# Patient Record
Sex: Female | Born: 1975 | Race: White | Hispanic: No | Smoking: Current every day smoker
Health system: Southern US, Community
[De-identification: ages and names within clinical notes are randomized; demographics above are authoritative.]

## PROBLEM LIST (undated history)

## (undated) DIAGNOSIS — F329 Major depressive disorder, single episode, unspecified: Secondary | ICD-10-CM

## (undated) DIAGNOSIS — F32A Depression, unspecified: Secondary | ICD-10-CM

## (undated) HISTORY — PX: CHOLECYSTECTOMY: SHX55

## (undated) HISTORY — PX: TUBAL LIGATION: SHX77

---

## 2001-02-10 ENCOUNTER — Emergency Department (HOSPITAL_COMMUNITY): Admission: EM | Admit: 2001-02-10 | Discharge: 2001-02-10 | Payer: Self-pay | Admitting: Emergency Medicine

## 2001-09-05 ENCOUNTER — Emergency Department (HOSPITAL_COMMUNITY): Admission: EM | Admit: 2001-09-05 | Discharge: 2001-09-05 | Payer: Self-pay | Admitting: Emergency Medicine

## 2001-09-05 ENCOUNTER — Encounter: Payer: Self-pay | Admitting: Emergency Medicine

## 2004-01-06 ENCOUNTER — Emergency Department (HOSPITAL_COMMUNITY): Admission: EM | Admit: 2004-01-06 | Discharge: 2004-01-06 | Payer: Self-pay

## 2006-10-13 ENCOUNTER — Emergency Department (HOSPITAL_COMMUNITY): Admission: EM | Admit: 2006-10-13 | Discharge: 2006-10-14 | Payer: Self-pay | Admitting: Emergency Medicine

## 2006-10-22 ENCOUNTER — Emergency Department (HOSPITAL_COMMUNITY): Admission: EM | Admit: 2006-10-22 | Discharge: 2006-10-22 | Payer: Self-pay | Admitting: Emergency Medicine

## 2007-08-20 ENCOUNTER — Inpatient Hospital Stay (HOSPITAL_COMMUNITY): Admission: AD | Admit: 2007-08-20 | Discharge: 2007-08-20 | Payer: Self-pay | Admitting: Obstetrics and Gynecology

## 2007-10-16 ENCOUNTER — Ambulatory Visit (HOSPITAL_COMMUNITY): Admission: RE | Admit: 2007-10-16 | Discharge: 2007-10-16 | Payer: Self-pay | Admitting: Obstetrics and Gynecology

## 2007-11-13 ENCOUNTER — Ambulatory Visit (HOSPITAL_COMMUNITY): Admission: RE | Admit: 2007-11-13 | Discharge: 2007-11-13 | Payer: Self-pay | Admitting: Obstetrics and Gynecology

## 2007-12-26 ENCOUNTER — Ambulatory Visit (HOSPITAL_COMMUNITY): Admission: RE | Admit: 2007-12-26 | Discharge: 2007-12-26 | Payer: Self-pay | Admitting: Obstetrics and Gynecology

## 2008-01-09 ENCOUNTER — Ambulatory Visit (HOSPITAL_COMMUNITY): Admission: RE | Admit: 2008-01-09 | Discharge: 2008-01-09 | Payer: Self-pay | Admitting: Obstetrics and Gynecology

## 2008-02-05 ENCOUNTER — Ambulatory Visit (HOSPITAL_COMMUNITY): Admission: RE | Admit: 2008-02-05 | Discharge: 2008-02-05 | Payer: Self-pay | Admitting: Obstetrics and Gynecology

## 2008-10-09 ENCOUNTER — Emergency Department (HOSPITAL_COMMUNITY): Admission: EM | Admit: 2008-10-09 | Discharge: 2008-10-09 | Payer: Self-pay | Admitting: Emergency Medicine

## 2009-02-19 ENCOUNTER — Emergency Department (HOSPITAL_BASED_OUTPATIENT_CLINIC_OR_DEPARTMENT_OTHER): Admission: EM | Admit: 2009-02-19 | Discharge: 2009-02-19 | Payer: Self-pay | Admitting: Emergency Medicine

## 2009-02-19 ENCOUNTER — Ambulatory Visit: Payer: Self-pay | Admitting: Radiology

## 2009-08-19 ENCOUNTER — Emergency Department (HOSPITAL_BASED_OUTPATIENT_CLINIC_OR_DEPARTMENT_OTHER): Admission: EM | Admit: 2009-08-19 | Discharge: 2009-08-19 | Payer: Self-pay | Admitting: Emergency Medicine

## 2009-11-26 ENCOUNTER — Emergency Department (HOSPITAL_BASED_OUTPATIENT_CLINIC_OR_DEPARTMENT_OTHER): Admission: EM | Admit: 2009-11-26 | Discharge: 2009-11-26 | Payer: Self-pay | Admitting: Emergency Medicine

## 2009-11-26 ENCOUNTER — Ambulatory Visit: Payer: Self-pay | Admitting: Diagnostic Radiology

## 2010-09-04 ENCOUNTER — Emergency Department (HOSPITAL_BASED_OUTPATIENT_CLINIC_OR_DEPARTMENT_OTHER)
Admission: EM | Admit: 2010-09-04 | Discharge: 2010-09-04 | Disposition: A | Discharge status: Home / Self Care | Payer: Medicaid Other | Attending: Emergency Medicine | Admitting: Emergency Medicine

## 2010-09-04 ENCOUNTER — Emergency Department (INDEPENDENT_AMBULATORY_CARE_PROVIDER_SITE_OTHER): Payer: Medicaid Other

## 2010-09-04 DIAGNOSIS — S93409A Sprain of unspecified ligament of unspecified ankle, initial encounter: Secondary | ICD-10-CM | POA: Insufficient documentation

## 2010-09-04 DIAGNOSIS — E669 Obesity, unspecified: Secondary | ICD-10-CM | POA: Insufficient documentation

## 2010-09-04 DIAGNOSIS — X500XXA Overexertion from strenuous movement or load, initial encounter: Secondary | ICD-10-CM

## 2010-09-04 DIAGNOSIS — F17200 Nicotine dependence, unspecified, uncomplicated: Secondary | ICD-10-CM | POA: Insufficient documentation

## 2010-09-04 DIAGNOSIS — W19XXXA Unspecified fall, initial encounter: Secondary | ICD-10-CM | POA: Insufficient documentation

## 2010-09-04 DIAGNOSIS — M25579 Pain in unspecified ankle and joints of unspecified foot: Secondary | ICD-10-CM

## 2010-09-04 DIAGNOSIS — Y92009 Unspecified place in unspecified non-institutional (private) residence as the place of occurrence of the external cause: Secondary | ICD-10-CM | POA: Insufficient documentation

## 2010-09-08 ENCOUNTER — Ambulatory Visit: Payer: Medicaid Other | Admitting: Family Medicine

## 2010-12-01 ENCOUNTER — Emergency Department (HOSPITAL_BASED_OUTPATIENT_CLINIC_OR_DEPARTMENT_OTHER)
Admission: EM | Admit: 2010-12-01 | Discharge: 2010-12-01 | Disposition: A | Discharge status: Home / Self Care | Payer: Medicaid Other | Attending: Emergency Medicine | Admitting: Emergency Medicine

## 2010-12-01 ENCOUNTER — Emergency Department (INDEPENDENT_AMBULATORY_CARE_PROVIDER_SITE_OTHER): Payer: Medicaid Other

## 2010-12-01 DIAGNOSIS — M549 Dorsalgia, unspecified: Secondary | ICD-10-CM | POA: Insufficient documentation

## 2010-12-01 DIAGNOSIS — M546 Pain in thoracic spine: Secondary | ICD-10-CM

## 2010-12-01 DIAGNOSIS — E669 Obesity, unspecified: Secondary | ICD-10-CM | POA: Insufficient documentation

## 2010-12-01 DIAGNOSIS — W19XXXA Unspecified fall, initial encounter: Secondary | ICD-10-CM

## 2010-12-01 DIAGNOSIS — G8929 Other chronic pain: Secondary | ICD-10-CM | POA: Insufficient documentation

## 2010-12-06 ENCOUNTER — Encounter: Payer: Self-pay | Admitting: *Deleted

## 2010-12-06 ENCOUNTER — Emergency Department (HOSPITAL_BASED_OUTPATIENT_CLINIC_OR_DEPARTMENT_OTHER)
Admission: EM | Admit: 2010-12-06 | Discharge: 2010-12-06 | Disposition: A | Discharge status: Home / Self Care | Payer: Medicaid Other | Attending: Emergency Medicine | Admitting: Emergency Medicine

## 2010-12-06 ENCOUNTER — Emergency Department (INDEPENDENT_AMBULATORY_CARE_PROVIDER_SITE_OTHER): Payer: Medicaid Other

## 2010-12-06 DIAGNOSIS — N898 Other specified noninflammatory disorders of vagina: Secondary | ICD-10-CM

## 2010-12-06 DIAGNOSIS — N920 Excessive and frequent menstruation with regular cycle: Secondary | ICD-10-CM | POA: Insufficient documentation

## 2010-12-06 LAB — CBC
MCH: 25.6 pg — ABNORMAL LOW (ref 26.0–34.0)
MCHC: 32.2 g/dL (ref 30.0–36.0)
MCV: 79.5 fL (ref 78.0–100.0)
Platelets: 229 10*3/uL (ref 150–400)
RDW: 16 % — ABNORMAL HIGH (ref 11.5–15.5)

## 2010-12-06 MED ORDER — NORGESTREL-ETHINYL ESTRADIOL 0.3-30 MG-MCG PO TABS: 1.0000 | ORAL_TABLET | Freq: Every day | ORAL | Status: DC

## 2010-12-06 NOTE — ED Provider Notes (Signed)
History     Chief Complaint  Patient presents with  . Vaginal Bleeding   Patient is a 35 y.o. female presenting with vaginal bleeding. The history is provided by the patient.  Vaginal Bleeding The current episode started yesterday. Associated symptoms include abdominal pain. The symptoms are aggravated by nothing. She has tried nothing for the symptoms.  Vaginal Bleeding The current episode started yesterday. Associated symptoms include abdominal pain. The symptoms are aggravated by nothing. She has tried nothing for the symptoms.  Pt complains of heavy vaginal bleeding today.  Pt reports she has not seen her GYN since she had her BTL.  Pt reports she has been through several pads today.  History reviewed. No pertinent past medical history.  Past Surgical History  Procedure Date  . Cesarean section   . Tubal ligation     History reviewed. No pertinent family history.  History  Substance Use Topics  . Smoking status: Current Everyday Smoker -- 1.0 packs/day  . Smokeless tobacco: Not on file  . Alcohol Use: No    OB History    Grav Para Term Preterm Abortions TAB SAB Ect Mult Living                  Review of Systems  Gastrointestinal: Positive for abdominal pain.  Genitourinary: Positive for vaginal bleeding.    Physical Exam  BP 103/60  Pulse 82  Temp(Src) 98.6 F (37 C) (Oral)  Resp 16  Wt 207 lb (93.895 kg)  SpO2 100%  LMP 11/29/2010  Physical Exam  Constitutional: She appears well-developed and well-nourished.  HENT:  Head: Normocephalic and atraumatic.  Eyes: Conjunctivae are normal. Pupils are equal, round, and reactive to light.  Neck: Normal range of motion. Neck supple.  Cardiovascular: Normal rate.   Abdominal: Soft. There is no tenderness.  Genitourinary: Uterus normal.       Moderate vaginal bleeding dark  Musculoskeletal: Normal range of motion.  Skin: Skin is warm.    ED Course  Procedures  MDM  Pt advised to followup with Gyn.  Pt  given RX for OrthoNovum to slow bleeding       Langston Masker, [place] 12/14/10 1213  Toy Baker, MD 12/16/10 [phone]

## 2010-12-06 NOTE — ED Notes (Addendum)
Pt c/o vaginal bleeding bright and dark red blood with clots. Hx ovarian cyst  Medical screening examination/treatment/procedure(s) were conducted as a shared visit with non-physician practitioner(s) and myself.  I personally evaluated the patient during the encounter  Toy Baker, MD 12/09/10 1006

## 2010-12-12 NOTE — ED Provider Notes (Signed)
Medical screening examination/treatment/procedure(s) were conducted as a shared visit with non-physician practitioner(s) and myself.  I personally evaluated the patient during the encounter  Toy Baker, MD 12/12/10 [phone]

## 2011-02-19 LAB — CBC
HCT: 38.3
MCV: 86.4
RBC: 4.43
WBC: 10.1

## 2011-02-19 LAB — URINALYSIS, ROUTINE W REFLEX MICROSCOPIC
Bilirubin Urine: NEGATIVE
Glucose, UA: NEGATIVE
Ketones, ur: NEGATIVE
pH: 6

## 2011-02-19 LAB — HCG, QUANTITATIVE, PREGNANCY: hCG, Beta Chain, Quant, S: 119141 — ABNORMAL HIGH

## 2011-03-27 ENCOUNTER — Encounter (HOSPITAL_BASED_OUTPATIENT_CLINIC_OR_DEPARTMENT_OTHER): Payer: Self-pay | Admitting: *Deleted

## 2011-03-27 ENCOUNTER — Emergency Department (HOSPITAL_BASED_OUTPATIENT_CLINIC_OR_DEPARTMENT_OTHER)
Admission: EM | Admit: 2011-03-27 | Discharge: 2011-03-27 | Disposition: A | Discharge status: Home / Self Care | Payer: Medicaid Other | Attending: Emergency Medicine | Admitting: Emergency Medicine

## 2011-03-27 ENCOUNTER — Emergency Department (INDEPENDENT_AMBULATORY_CARE_PROVIDER_SITE_OTHER): Payer: Medicaid Other

## 2011-03-27 DIAGNOSIS — K8020 Calculus of gallbladder without cholecystitis without obstruction: Secondary | ICD-10-CM

## 2011-03-27 DIAGNOSIS — R1032 Left lower quadrant pain: Secondary | ICD-10-CM

## 2011-03-27 DIAGNOSIS — J45909 Unspecified asthma, uncomplicated: Secondary | ICD-10-CM | POA: Insufficient documentation

## 2011-03-27 DIAGNOSIS — F3289 Other specified depressive episodes: Secondary | ICD-10-CM | POA: Insufficient documentation

## 2011-03-27 DIAGNOSIS — F17200 Nicotine dependence, unspecified, uncomplicated: Secondary | ICD-10-CM | POA: Insufficient documentation

## 2011-03-27 DIAGNOSIS — N390 Urinary tract infection, site not specified: Secondary | ICD-10-CM | POA: Insufficient documentation

## 2011-03-27 DIAGNOSIS — F329 Major depressive disorder, single episode, unspecified: Secondary | ICD-10-CM | POA: Insufficient documentation

## 2011-03-27 HISTORY — DX: Major depressive disorder, single episode, unspecified: F32.9

## 2011-03-27 HISTORY — DX: Depression, unspecified: F32.A

## 2011-03-27 LAB — DIFFERENTIAL
Basophils Absolute: 0 10*3/uL (ref 0.0–0.1)
Basophils Relative: 0 % (ref 0–1)
Eosinophils Absolute: 0.2 10*3/uL (ref 0.0–0.7)
Monocytes Absolute: 0.7 10*3/uL (ref 0.1–1.0)
Neutro Abs: 6.7 10*3/uL (ref 1.7–7.7)

## 2011-03-27 LAB — BASIC METABOLIC PANEL
Calcium: 9.6 mg/dL (ref 8.4–10.5)
Chloride: 102 mEq/L (ref 96–112)
Creatinine, Ser: 0.7 mg/dL (ref 0.50–1.10)
GFR calc Af Amer: >90 mL/min (ref >90)
GFR calc non Af Amer: >90 mL/min (ref >90)

## 2011-03-27 LAB — URINE MICROSCOPIC-ADD ON

## 2011-03-27 LAB — URINALYSIS, ROUTINE W REFLEX MICROSCOPIC
Glucose, UA: NEGATIVE mg/dL
Hgb urine dipstick: NEGATIVE
Specific Gravity, Urine: 1.023 (ref 1.005–1.030)
Urobilinogen, UA: 1 mg/dL (ref 0.0–1.0)

## 2011-03-27 LAB — PREGNANCY, URINE: Preg Test, Ur: NEGATIVE

## 2011-03-27 LAB — CBC
HCT: 35.5 % — ABNORMAL LOW (ref 36.0–46.0)
MCH: 26 pg (ref 26.0–34.0)
MCHC: 32.1 g/dL (ref 30.0–36.0)
RDW: 16.2 % — ABNORMAL HIGH (ref 11.5–15.5)

## 2011-03-27 MED ORDER — HYDROMORPHONE HCL 1 MG/ML IJ SOLN
1.0000 mg | Freq: Once | INTRAMUSCULAR | Status: AC
  Administered 2011-03-27: 1 mg via INTRAVENOUS
  Filled 2011-03-27: qty 1

## 2011-03-27 MED ORDER — DEXTROSE 5 % IV SOLN
1.0000 g | Freq: Once | INTRAVENOUS | Status: AC
  Administered 2011-03-27: 1 g via INTRAVENOUS
  Filled 2011-03-27: qty 1

## 2011-03-27 MED ORDER — KETOROLAC TROMETHAMINE 30 MG/ML IJ SOLN
30.0000 mg | Freq: Once | INTRAMUSCULAR | Status: AC
  Administered 2011-03-27: 30 mg via INTRAVENOUS
  Filled 2011-03-27: qty 1

## 2011-03-27 MED ORDER — ONDANSETRON HCL 4 MG/2ML IJ SOLN
4.0000 mg | Freq: Once | INTRAMUSCULAR | Status: AC
  Administered 2011-03-27: 4 mg via INTRAVENOUS
  Filled 2011-03-27: qty 2

## 2011-03-27 MED ORDER — OXYCODONE-ACETAMINOPHEN 5-325 MG PO TABS
2.0000 | ORAL_TABLET | Freq: Once | ORAL | Status: AC
  Administered 2011-03-27: 2 via ORAL
  Filled 2011-03-27: qty 2

## 2011-03-27 MED ORDER — CEPHALEXIN 500 MG PO CAPS: 500.0000 mg | ORAL_CAPSULE | Freq: Four times a day (QID) | ORAL | Status: AC

## 2011-03-27 MED ORDER — NAPROXEN 500 MG PO TABS: 500.0000 mg | ORAL_TABLET | Freq: Two times a day (BID) | ORAL | Status: DC

## 2011-03-27 MED ORDER — SODIUM CHLORIDE 0.9 % IV BOLUS (SEPSIS)
1000.0000 mL | Freq: Once | INTRAVENOUS | Status: AC
  Administered 2011-03-27: 1000 mL via INTRAVENOUS

## 2011-03-27 MED ORDER — HYDROCODONE-ACETAMINOPHEN 5-500 MG PO TABS: 1.0000 | ORAL_TABLET | Freq: Four times a day (QID) | ORAL | Status: AC | PRN

## 2011-03-27 NOTE — ED Notes (Signed)
Patient states she woke up 3days ago with LLQ abdominal pain associated with nausea, no vomiting or diarrhea.

## 2011-03-27 NOTE — ED Provider Notes (Signed)
History     CSN: [id number] Arrival date & time: 03/27/2011  3:22 PM   First MD Initiated Contact with Patient 03/27/11 1544      Chief Complaint  Patient presents with  . Abdominal Pain    left lower quadrant    (Consider location/radiation/quality/duration/timing/severity/associated sxs/prior treatment) HPI Comments: Left flank pain which has been intermittent and sharp the past 3 days. No specific exacerbating or alleviating measures  Patient is a 35 y.o. female presenting with abdominal pain. The history is provided by the patient. No language interpreter was used.  Abdominal Pain The primary symptoms of the illness include abdominal pain and nausea. The primary symptoms of the illness do not include fever, fatigue, shortness of breath, vomiting, diarrhea, hematochezia, dysuria, vaginal discharge or vaginal bleeding. The current episode started more than 2 days ago (3 days ago). The onset of the illness was gradual. The problem has been gradually worsening.  The abdominal pain is located in the LLQ and left flank. The abdominal pain does not radiate. The abdominal pain is relieved by nothing. The abdominal pain is exacerbated by movement.  The patient states that she believes she is currently not pregnant. The patient has not had a change in bowel habit. Additional symptoms associated with the illness include back pain. Symptoms associated with the illness do not include chills, constipation, urgency, hematuria or frequency. Significant associated medical issues do not include GERD, inflammatory bowel disease, diabetes or gallstones.    Past Medical History  Diagnosis Date  . Asthma   . Depression     Past Surgical History  Procedure Date  . Cesarean section   . Tubal ligation     No family history on file.  History  Substance Use Topics  . Smoking status: Current Everyday Smoker -- 1.0 packs/day  . Smokeless tobacco: Not on file  . Alcohol Use: No    OB History    Grav Para Term Preterm Abortions TAB SAB Ect Mult Living                  Review of Systems  Constitutional: Negative for fever, chills, activity change, appetite change and fatigue.  HENT: Negative for congestion, sore throat, rhinorrhea, neck pain and neck stiffness.   Respiratory: Negative for cough and shortness of breath.   Cardiovascular: Negative for chest pain and palpitations.  Gastrointestinal: Positive for nausea and abdominal pain. Negative for vomiting, diarrhea, constipation and hematochezia.  Genitourinary: Positive for flank pain. Negative for dysuria, urgency, frequency, hematuria, vaginal bleeding and vaginal discharge.  Musculoskeletal: Positive for back pain.  Neurological: Negative for dizziness, weakness, light-headedness, numbness and headaches.  All other systems reviewed and are negative.    Allergies  Asa buff (mag; Peanut butter flavor; and Codeine  Home Medications   Current Outpatient Rx  Name Route Sig Dispense Refill  . ACETAMINOPHEN 500 MG PO TABS Oral Take 1,000 mg by mouth as needed. For pain     . CEPHALEXIN 500 MG PO CAPS Oral Take 1 capsule (500 mg total) by mouth 4 (four) times daily. 28 capsule 0  . HYDROCODONE-ACETAMINOPHEN 5-500 MG PO TABS Oral Take 1-2 tablets by mouth every 6 (six) hours as needed for pain. 15 tablet 0  . NAPROXEN 500 MG PO TABS Oral Take 1 tablet (500 mg total) by mouth 2 (two) times daily. 30 tablet 0  . NORGESTREL-ETHINYL ESTRADIOL 0.3-30 MG-MCG PO TABS Oral Take 1 tablet by mouth daily. 1 tablet 1    BP 123/69  Pulse 77  Temp(Src) 98.1 F (36.7 C) (Oral)  Resp 20  Ht 4\' 10"  (1.473 m)  Wt 201 lb (91.173 kg)  BMI 42.01 kg/m2  SpO2 100%  LMP 03/18/2011  Physical Exam  Nursing note and vitals reviewed. Constitutional: She is oriented to person, place, and time. She appears well-developed and well-nourished. No distress.  HENT:  Head: Normocephalic and atraumatic.  Mouth/Throat: Oropharynx is clear and moist.    Eyes: Conjunctivae and EOM are normal. Pupils are equal, round, and reactive to light.  Neck: Normal range of motion. Neck supple.  Cardiovascular: Normal rate, regular rhythm, normal heart sounds and intact distal pulses.  Exam reveals no gallop and no friction rub.   No murmur heard. Pulmonary/Chest: Effort normal and breath sounds normal. No respiratory distress.  Abdominal: Soft. Bowel sounds are normal. She exhibits no distension. There is tenderness (LLQ). There is no rebound and no guarding.  Genitourinary: Vagina normal. No vaginal discharge found.       Bimanual examination without cmt or adenexal tenderness  Musculoskeletal: Normal range of motion.  Neurological: She is alert and oriented to person, place, and time.  Skin: Skin is warm and dry. No rash noted.    ED Course  Procedures (including critical care time)  Labs Reviewed  URINALYSIS, ROUTINE W REFLEX MICROSCOPIC - Abnormal; Notable for the following:    Leukocytes, UA MODERATE (*)    All other components within normal limits  CBC - Abnormal; Notable for the following:    Hemoglobin 11.4 (*)    HCT 35.5 (*)    RDW 16.2 (*)    All other components within normal limits  PREGNANCY, URINE  URINE MICROSCOPIC-ADD ON  DIFFERENTIAL  BASIC METABOLIC PANEL   Ct Abdomen Pelvis Wo Contrast  03/27/2011  *RADIOLOGY REPORT*  Clinical Data: Left lower quadrant pain and left flank pain  CT ABDOMEN AND PELVIS WITHOUT CONTRAST  Technique:  Multidetector CT imaging of the abdomen and pelvis was performed following the standard protocol without intravenous contrast.  Comparison: None.  Findings: The lung bases are clear.  The liver is unremarkable in the unenhanced state.  At least two gallstones are present within the gallbladder, with the larger measuring 14 mm in diameter.  No gallbladder wall thickening is seen.  The pancreas is normal in size and the pancreatic duct is not dilated.  The adrenal glands and spleen are unremarkable.   The stomach is not well distended. No renal calculi are seen and there is no evidence of hydronephrosis.  The abdominal aorta is normal in caliber.  No adenopathy is seen.  The uterus is located anteriorly and appears normal in size.  Small follicles are noted on the right.  No free fluid is seen within the pelvis.  The urinary bladder is not well distended.  The terminal ileum is unremarkable.  The appendix is not well seen but no evidence of appendicitis is noted.  No bony abnormality is seen.  IMPRESSION:  1.  No acute abnormality.  No evidence of diverticulitis. 2.  At least two gallstones, with the larger measuring 14 mm in diameter.  No evidence of acute cholecystitis by CT. 3.  No renal calculi.  No hydronephrosis.  Original Report Authenticated By: Juline Patch, M.D.     1. UTI (lower urinary tract infection)   2. Abdominal pain       MDM  Patient Lam[place] urinary tract infection on urinalysis. She did describe colicky, intermittent pain therefore I performed a CT  scan to rule out a kidney stone. This was negative. Bimanual examination showed no cervical motion tenderness. She denies vaginal discharge. At this time there is no indication to treat for any pelvic disease. She received a dose of Rocephin for her urinary tract infection emergency department. She'll be discharged home on Keflex and pain medication. She recently completed her menstrual period therefore her pain may also be due to an ovarian cyst. She's instructed to followup with her primary care physician as well as her OB/GYN        Dayton Bailiff, MD 03/27/11 [phone]

## 2011-06-20 ENCOUNTER — Other Ambulatory Visit: Payer: Self-pay

## 2011-06-20 ENCOUNTER — Other Ambulatory Visit (HOSPITAL_COMMUNITY)
Admission: RE | Admit: 2011-06-20 | Discharge: 2011-06-20 | Disposition: A | Discharge status: Home / Self Care | Payer: Medicaid Other | Source: Ambulatory Visit | Attending: Obstetrics and Gynecology | Admitting: Obstetrics and Gynecology

## 2011-06-20 DIAGNOSIS — Z1159 Encounter for screening for other viral diseases: Secondary | ICD-10-CM | POA: Insufficient documentation

## 2011-06-20 DIAGNOSIS — Z124 Encounter for screening for malignant neoplasm of cervix: Secondary | ICD-10-CM | POA: Insufficient documentation

## 2011-07-23 ENCOUNTER — Emergency Department (INDEPENDENT_AMBULATORY_CARE_PROVIDER_SITE_OTHER): Payer: Medicaid Other

## 2011-07-23 ENCOUNTER — Encounter (HOSPITAL_BASED_OUTPATIENT_CLINIC_OR_DEPARTMENT_OTHER): Payer: Self-pay | Admitting: *Deleted

## 2011-07-23 ENCOUNTER — Emergency Department (HOSPITAL_BASED_OUTPATIENT_CLINIC_OR_DEPARTMENT_OTHER)
Admission: EM | Admit: 2011-07-23 | Discharge: 2011-07-23 | Disposition: A | Discharge status: Home / Self Care | Payer: Medicaid Other | Attending: Emergency Medicine | Admitting: Emergency Medicine

## 2011-07-23 DIAGNOSIS — Y92009 Unspecified place in unspecified non-institutional (private) residence as the place of occurrence of the external cause: Secondary | ICD-10-CM | POA: Insufficient documentation

## 2011-07-23 DIAGNOSIS — F3289 Other specified depressive episodes: Secondary | ICD-10-CM | POA: Insufficient documentation

## 2011-07-23 DIAGNOSIS — F17200 Nicotine dependence, unspecified, uncomplicated: Secondary | ICD-10-CM | POA: Insufficient documentation

## 2011-07-23 DIAGNOSIS — J45909 Unspecified asthma, uncomplicated: Secondary | ICD-10-CM

## 2011-07-23 DIAGNOSIS — Y93E5 Activity, floor mopping and cleaning: Secondary | ICD-10-CM | POA: Insufficient documentation

## 2011-07-23 DIAGNOSIS — S20229A Contusion of unspecified back wall of thorax, initial encounter: Secondary | ICD-10-CM | POA: Insufficient documentation

## 2011-07-23 DIAGNOSIS — R079 Chest pain, unspecified: Secondary | ICD-10-CM

## 2011-07-23 DIAGNOSIS — W1809XA Striking against other object with subsequent fall, initial encounter: Secondary | ICD-10-CM | POA: Insufficient documentation

## 2011-07-23 DIAGNOSIS — F329 Major depressive disorder, single episode, unspecified: Secondary | ICD-10-CM | POA: Insufficient documentation

## 2011-07-23 DIAGNOSIS — W19XXXA Unspecified fall, initial encounter: Secondary | ICD-10-CM

## 2011-07-23 DIAGNOSIS — R0609 Other forms of dyspnea: Secondary | ICD-10-CM

## 2011-07-23 LAB — PREGNANCY, URINE: Preg Test, Ur: NEGATIVE

## 2011-07-23 MED ORDER — HYDROCODONE-ACETAMINOPHEN 5-325 MG PO TABS: 2.0000 | ORAL_TABLET | ORAL | Status: AC | PRN

## 2011-07-23 NOTE — ED Notes (Signed)
Staff advised by friend who brought patient in, that pt was found drinking Nitquil straight out of bottle on his arrival to her house.

## 2011-07-23 NOTE — ED Notes (Signed)
She was standing on a chair in the shower and it fell back and she hit her back on the faucet and hit her head. Denies LOC,  C/o mid back pain.

## 2011-07-23 NOTE — Discharge Instructions (Signed)
Contusion A bruise (contusion) or hematoma is a collection of blood under skin causing an area of discoloration. It is caused by an injury to blood vessels beneath the injured area with a release of blood into that area. As blood accumulates it is known as a hematoma. This collection of blood causes a blue to dark blue color. As the injury improves over days to weeks it turns to a yellowish color and then usually disappears completely over the same period of time. These generally resolve completely without problems. The hematoma rarely requires drainage. HOME CARE INSTRUCTIONS   Apply ice to the injured area for 15 to 20 minutes 3 to 4 times per day for the first 1 or 2 days.   Put the ice in a plastic bag and place a towel between the bag of ice and your skin. Discontinue the ice if it causes pain.   If bleeding is more than just a little, apply pressure to the area for at least thirty minutes to decrease the amount of bruising. Apply pressure and ice as your caregiver suggests.   If the injury is on an extremity, elevation of that part may help to decrease pain and swelling. Wrapping with an ace or supportive wrap may also be helpful. If the bruise is on a lower extremity and is painful, crutches may be helpful for a couple days.   If you have been given a tetanus shot because the skin was broken, your arm may get swollen, red and warm to touch at the shot site. This is a normal response to the medicine in the shot. If you did not receive a tetanus shot today because you did not recall when your last one was given, make sure to check with your caregiver's office and determine if one is needed. Generally for a "dirty" wound, you should receive a tetanus booster if you have not had one in the last five years. If you have a "clean" wound, you should receive a tetanus booster if you have not had one within the last ten years.  SEEK MEDICAL CARE IF:   You have pain not controlled with over the counter  medications. Only take over-the-counter or prescription medicines for pain, discomfort, or fever as directed by your caregiver. Do not use aspirin as it may cause bleeding.   You develop increasing pain or swelling in the area of injury.   You develop any problems which seem worse than the problems which brought you in.  SEEK IMMEDIATE MEDICAL CARE IF:   You have a fever.   You develop severe pain in the area of the bruise out of proportion to the initial injury.   The bruised area becomes red, tender, and swollen.  MAKE SURE YOU:   Understand these instructions.   Will watch your condition.   Will get help right away if you are not doing well or get worse.  Document Released: 02/21/2005 Document Revised: 01/24/2011 Document Reviewed: 12/31/2007 [employer] Patient Information 2012 [employer]. 

## 2011-07-23 NOTE — ED Provider Notes (Signed)
History     CSN: [id number]  Arrival date & time 07/23/11  2155   First MD Initiated Contact with Patient 07/23/11 2220      Chief Complaint  Patient presents with  . Fall    (Consider location/radiation/quality/duration/timing/severity/associated sxs/prior treatment) Patient is a 36 y.o. female presenting with fall. The history is provided by the patient. No language interpreter was used.  Fall The accident occurred 3 to 5 hours ago. She fell from a height of 3 to 5 ft. She landed on carpet. The point of impact was the head. The pain is present in the head. The pain is at a severity of 6/10. The pain is moderate. She was not ambulatory at the scene. There was no entrapment after the fall. There was no drug use involved in the accident. She has tried nothing for the symptoms.  Pt reports she was cleaning out her shower and fell hitting her back.  Pt reports she hit her head.  Pt did not black out.    Past Medical History  Diagnosis Date  . Asthma   . Depression     Past Surgical History  Procedure Date  . Cesarean section   . Tubal ligation     No family history on file.  History  Substance Use Topics  . Smoking status: Current Everyday Smoker -- 1.0 packs/day  . Smokeless tobacco: Not on file  . Alcohol Use: No    OB History    Grav Para Term Preterm Abortions TAB SAB Ect Mult Living                  Review of Systems  All other systems reviewed and are negative.    Allergies  Asa buff (mag; Peanut butter flavor; and Codeine  Home Medications   Current Outpatient Rx  Name Route Sig Dispense Refill  . ACETAMINOPHEN 500 MG PO TABS Oral Take 1,000 mg by mouth as needed. For pain       BP 126/91  Pulse 88  Temp(Src) 98.8 F (37.1 C) (Oral)  Resp 16  SpO2 100%  LMP 06/29/2011  Physical Exam  Nursing note and vitals reviewed. Constitutional: She is oriented to person, place, and time. She appears well-developed and well-nourished.  HENT:  Head:  Normocephalic and atraumatic.  Right Ear: External ear normal.  Left Ear: External ear normal.  Nose: Nose normal.  Mouth/Throat: Oropharynx is clear and moist.  Eyes: Conjunctivae and EOM are normal. Pupils are equal, round, and reactive to light.  Neck: Normal range of motion. Neck supple.  Cardiovascular: Normal rate and normal heart sounds.   Pulmonary/Chest: Effort normal and breath sounds normal.  Abdominal: Soft.  Musculoskeletal: Normal range of motion.       Tender bilat upper back,  From,  Neurological: She is alert and oriented to person, place, and time.  Skin: Skin is warm.  Psychiatric: She has a normal mood and affect.    ED Course  Procedures (including critical care time)   Labs Reviewed  PREGNANCY, URINE   No results found.   No diagnosis found.    MDM  Urine preg negative,     Results for orders placed during the hospital encounter of 07/23/11  PREGNANCY, URINE      Component Value Range   Preg Test, Ur NEGATIVE  NEGATIVE    Dg Chest 2 View  07/23/2011  *RADIOLOGY REPORT*  Clinical Data: Larey Seat in shower; landed on back.  Difficulty breathing, with chest pain.  History of asthma.  CHEST - 2 VIEW  Comparison: Thoracic spine radiographs performed 12/01/2010  Findings: The lungs are well-aerated and clear.  There is no evidence of focal opacification, pleural effusion or pneumothorax.  The heart is normal in size; the mediastinal contour is within normal limits.  No acute osseous abnormalities are seen.  IMPRESSION: No displaced rib fractures seen; no acute cardiopulmonary process identified.  Original Report Authenticated By: Tonia Ghent, M.D.      Langston Masker, [place] 07/23/11 2321  Langston Masker, [place] 07/23/11 2340

## 2011-07-25 NOTE — ED Provider Notes (Signed)
History/physical exam/procedure(s) were performed by non-physician practitioner and as supervising physician I was immediately available for consultation/collaboration. I have reviewed all notes and am in agreement with care and plan.   Hilario Quarry, MD 07/25/11 1030

## 2011-09-23 ENCOUNTER — Encounter (HOSPITAL_BASED_OUTPATIENT_CLINIC_OR_DEPARTMENT_OTHER): Payer: Self-pay | Admitting: Emergency Medicine

## 2011-09-23 ENCOUNTER — Emergency Department (HOSPITAL_BASED_OUTPATIENT_CLINIC_OR_DEPARTMENT_OTHER)
Admission: EM | Admit: 2011-09-23 | Discharge: 2011-09-23 | Disposition: A | Discharge status: Home / Self Care | Payer: Medicaid Other | Attending: Emergency Medicine | Admitting: Emergency Medicine

## 2011-09-23 DIAGNOSIS — M65839 Other synovitis and tenosynovitis, unspecified forearm: Secondary | ICD-10-CM | POA: Insufficient documentation

## 2011-09-23 DIAGNOSIS — F17200 Nicotine dependence, unspecified, uncomplicated: Secondary | ICD-10-CM | POA: Insufficient documentation

## 2011-09-23 DIAGNOSIS — E669 Obesity, unspecified: Secondary | ICD-10-CM | POA: Insufficient documentation

## 2011-09-23 DIAGNOSIS — R030 Elevated blood-pressure reading, without diagnosis of hypertension: Secondary | ICD-10-CM | POA: Insufficient documentation

## 2011-09-23 MED ORDER — IBUPROFEN 800 MG PO TABS
ORAL_TABLET | ORAL | Status: AC
  Filled 2011-09-23: qty 1

## 2011-09-23 MED ORDER — ACETAMINOPHEN 500 MG PO TABS
1000.0000 mg | ORAL_TABLET | Freq: Once | ORAL | Status: AC
  Administered 2011-09-23: 1000 mg via ORAL
  Filled 2011-09-23: qty 2

## 2011-09-23 MED ORDER — HYDROCODONE-ACETAMINOPHEN 5-325 MG PO TABS: 2.0000 | ORAL_TABLET | ORAL | Status: AC | PRN

## 2011-09-23 NOTE — ED Provider Notes (Signed)
History     CSN: [id number]  Arrival date & time 09/23/11  1005   First MD Initiated Contact with Patient 09/23/11 1114      Chief Complaint  Patient presents with  . Hand Pain    (Consider location/radiation/quality/duration/timing/severity/associated sxs/prior treatment) HPI Complains of right hand pain radiating forearm onset 2 weeks ago worse with moving her hand improved with remaining still has been treated with Tylenol with minimal relief no fever no injury no numbness no other complaint. No other associated symptoms pain is moderate accompanied by swelling of her hand, dull in quality Past Medical History  Diagnosis Date  . Asthma   . Depression     Past Surgical History  Procedure Date  . Cesarean section   . Tubal ligation     No family history on file.  History  Substance Use Topics  . Smoking status: Current Everyday Smoker -- 1.0 packs/day  . Smokeless tobacco: Not on file  . Alcohol Use: No    OB History    Grav Para Term Preterm Abortions TAB SAB Ect Mult Living                  Review of Systems  Constitutional: Negative.   HENT: Negative.   Respiratory: Negative.   Cardiovascular: Negative.   Gastrointestinal: Negative.   Musculoskeletal: Positive for arthralgias.  Skin: Negative.   Neurological: Negative.   Hematological: Negative.   Psychiatric/Behavioral: Negative.   All other systems reviewed and are negative.    Allergies  Asa buff (mag; Peanut butter flavor; and Codeine  Home Medications   Current Outpatient Rx  Name Route Sig Dispense Refill  . ACETAMINOPHEN 500 MG PO TABS Oral Take 1,000 mg by mouth as needed. For pain       BP 146/90  Pulse 90  Temp(Src) 98.1 F (36.7 C) (Oral)  Resp 16  SpO2 100%  LMP 09/13/2011  Physical Exam  Nursing note and vitals reviewed. Constitutional: She appears well-developed and well-nourished. No distress.  HENT:  Head: Normocephalic and atraumatic.  Eyes: EOM are normal. Pupils  are equal, round, and reactive to light.  Neck: Neck supple.  Cardiovascular: Normal rate.   Pulmonary/Chest: Effort normal.  Abdominal:       Obese  Musculoskeletal:       Right upper extremity without redness swelling or point tenderness full range of motion neurovascular intact radial pulse 2+ all fingers with good capillary refill she has pain at the MCP joints and at the forearm with active extension of her hand; all other extremities without redness swelling or tenderness neurovascular intact    ED Course  Procedures (including critical care time)  Labs Reviewed - No data to display No results found.   No diagnosis found.    MDM  Suspect tendinitis no evidence of infection. Patient not given narcotic medication in the ED as she is driving home. Plan rest ice prescription Vicodin will not prescribe NSAID as patient has anaphylactic reaction to aspirin Referral Dr. Amanda Pea . BP recheck 3 weeks Diagnosis #1 tendinitis right hand #2 elevated blood pressure        Doug Sou, MD 09/23/11 1136  Doug Sou, MD 09/23/11 1541

## 2011-09-23 NOTE — Discharge Instructions (Signed)
Place an ice pack on your hand for 30 minutes at a time 4 times daily to help with discomfort. Take Tylenol for mild pain or the pain medicine prescribed for bad pain. Call Dr. Amanda Pea to schedule an office appointment if not improved in a week. He should get your blood pressure recheck within the next 3 weeks

## 2011-09-23 NOTE — ED Notes (Signed)
Pt c/o RT hand pain w/ radiation to arm x 2 days; no known injury

## 2012-03-07 ENCOUNTER — Emergency Department (HOSPITAL_BASED_OUTPATIENT_CLINIC_OR_DEPARTMENT_OTHER): Payer: Medicaid Other

## 2012-03-07 ENCOUNTER — Emergency Department (HOSPITAL_BASED_OUTPATIENT_CLINIC_OR_DEPARTMENT_OTHER)
Admission: EM | Admit: 2012-03-07 | Discharge: 2012-03-07 | Disposition: A | Discharge status: Home / Self Care | Payer: Medicaid Other | Attending: Emergency Medicine | Admitting: Emergency Medicine

## 2012-03-07 ENCOUNTER — Encounter (HOSPITAL_BASED_OUTPATIENT_CLINIC_OR_DEPARTMENT_OTHER): Payer: Self-pay | Admitting: *Deleted

## 2012-03-07 DIAGNOSIS — F17200 Nicotine dependence, unspecified, uncomplicated: Secondary | ICD-10-CM | POA: Insufficient documentation

## 2012-03-07 DIAGNOSIS — F329 Major depressive disorder, single episode, unspecified: Secondary | ICD-10-CM | POA: Insufficient documentation

## 2012-03-07 DIAGNOSIS — J029 Acute pharyngitis, unspecified: Secondary | ICD-10-CM | POA: Insufficient documentation

## 2012-03-07 DIAGNOSIS — Z885 Allergy status to narcotic agent status: Secondary | ICD-10-CM | POA: Insufficient documentation

## 2012-03-07 DIAGNOSIS — F3289 Other specified depressive episodes: Secondary | ICD-10-CM | POA: Insufficient documentation

## 2012-03-07 DIAGNOSIS — J45909 Unspecified asthma, uncomplicated: Secondary | ICD-10-CM | POA: Insufficient documentation

## 2012-03-07 DIAGNOSIS — Z9109 Other allergy status, other than to drugs and biological substances: Secondary | ICD-10-CM | POA: Insufficient documentation

## 2012-03-07 MED ORDER — OXYCODONE-ACETAMINOPHEN 5-325 MG PO TABS: 1.0000 | ORAL_TABLET | ORAL | Status: DC | PRN

## 2012-03-07 MED ORDER — BENZOCAINE-MENTHOL 6-10 MG MT LOZG: 1.0000 | LOZENGE | OROMUCOSAL | Status: DC | PRN

## 2012-03-07 MED ORDER — OXYCODONE-ACETAMINOPHEN 5-325 MG PO TABS
2.0000 | ORAL_TABLET | Freq: Once | ORAL | Status: AC
  Administered 2012-03-07: 2 via ORAL
  Filled 2012-03-07 (×2): qty 2

## 2012-03-07 MED ORDER — ALBUTEROL SULFATE HFA 108 (90 BASE) MCG/ACT IN AERS: 1.0000 | INHALATION_SPRAY | Freq: Four times a day (QID) | RESPIRATORY_TRACT | Status: DC | PRN

## 2012-03-07 NOTE — ED Notes (Signed)
Sore throat. States she feels like she has strep.

## 2012-03-07 NOTE — ED Provider Notes (Signed)
History     CSN: [id number]  Arrival date & time 03/07/12  1515   First MD Initiated Contact with Patient 03/07/12 1529      Chief Complaint  Patient presents with  . Sore Throat    (Consider location/radiation/quality/duration/timing/severity/associated sxs/prior treatment) HPI Comments: Patient is a 36 year old female with a past medical history of asthma who presents with a 2 day history of sore throat, unproductive cough, and general malaise. Her symptoms started gradually and have progressively worsening since the onset. Patient denies alleviating/aggravating factors. Nothing makes the symptoms better and she has not taken anything for her symptoms. Patient reports feeling "really bad." She reports associated headache. She denies fever, NVD, chest pain, SOB, abdominal pain.  Patient is a 36 y.o. female presenting with pharyngitis.  Sore Throat Associated symptoms include coughing, fatigue, headaches and a sore throat.    Past Medical History  Diagnosis Date  . Asthma   . Depression     Past Surgical History  Procedure Date  . Cesarean section   . Tubal ligation     No family history on file.  History  Substance Use Topics  . Smoking status: Current Every Day Smoker -- 1.0 packs/day  . Smokeless tobacco: Not on file  . Alcohol Use: No    OB History    Grav Para Term Preterm Abortions TAB SAB Ect Mult Living                  Review of Systems  Constitutional: Positive for fatigue.  HENT: Positive for sore throat.   Respiratory: Positive for cough.   Neurological: Positive for headaches.  All other systems reviewed and are negative.    Allergies  Asa buff (mag; Peanut butter flavor; and Codeine  Home Medications   Current Outpatient Rx  Name Route Sig Dispense Refill  . ACETAMINOPHEN 500 MG PO TABS Oral Take 1,000 mg by mouth as needed. For pain       BP 128/60  Pulse 92  Temp 98.3 F (36.8 C) (Oral)  Resp 20  SpO2 100%  Physical Exam    Nursing note and vitals reviewed. Constitutional: She is oriented to person, place, and time. She appears well-developed and well-nourished. No distress.  HENT:  Head: Normocephalic and atraumatic.  Mouth/Throat: No oropharyngeal exudate.       Posterior pharynx erythematous without tonsillar exudate.   Eyes: Conjunctivae normal and EOM are normal. No scleral icterus.  Neck: Normal range of motion. Neck supple.       Neck tender to palpation with notable cervical adenopathy.   Cardiovascular: Normal rate and regular rhythm.  Exam reveals no gallop and no friction rub.   No murmur heard. Pulmonary/Chest: Effort normal. She has wheezes. She has no rales. She exhibits no tenderness.       Right upper lobe wheezing noted on auscultation. No wheezing, rhonchi noted in other lung fields.   Abdominal: Soft. There is no tenderness.  Musculoskeletal: Normal range of motion.  Lymphadenopathy:    She has cervical adenopathy.  Neurological: She is alert and oriented to person, place, and time. Coordination normal.       Speech is goal-oriented. Moves limbs without ataxia.   Skin: Skin is warm and dry.  Psychiatric: She has a normal mood and affect. Her behavior is normal.    ED Course  Procedures (including critical care time)   Labs Reviewed  RAPID STREP SCREEN   Dg Chest 2 View  03/07/2012  *RADIOLOGY  REPORT*  Clinical Data: Sore throat, cough  CHEST - 2 VIEW  Comparison: Prior chest x-ray 07/23/2011  Findings: The lungs are well-aerated and free from pulmonary edema, focal airspace consolidation or pulmonary nodule.  Cardiac and mediastinal contours are within normal limits.  No pneumothorax, or pleural effusion. No acute osseous findings.  IMPRESSION:  No acute cardiopulmonary disease.   Original Report Authenticated By: Sterling Big, M.D.      1. Viral pharyngitis       MDM  4:05 PM Patient will have a chest xray done due to right upper lobe wheezing noted on exam. I will  give her Percocet for pain.   7:03 PM Patient's strep test and xray negative for acute process. Dr. Fredderick Phenix saw the patient. Patient most likely has viral pharyngitis. Dr. Fredderick Phenix spoke with the patient about antibiotic nonuse. Patient expresses understanding. I will discharge her with lortab elixir and sore throat lozenges. No further evaluation needed at this time.       Emilia Beck, PA-C 03/07/12 2212

## 2012-03-07 NOTE — ED Provider Notes (Signed)
Medical screening examination/treatment/procedure(s) were performed by non-physician practitioner and as supervising physician I was immediately available for consultation/collaboration.   Rolan Bucco, MD 03/07/12 [phone]

## 2012-04-10 ENCOUNTER — Emergency Department (HOSPITAL_BASED_OUTPATIENT_CLINIC_OR_DEPARTMENT_OTHER): Payer: Medicaid Other

## 2012-04-10 ENCOUNTER — Encounter (HOSPITAL_BASED_OUTPATIENT_CLINIC_OR_DEPARTMENT_OTHER): Payer: Self-pay | Admitting: Emergency Medicine

## 2012-04-10 ENCOUNTER — Emergency Department (HOSPITAL_BASED_OUTPATIENT_CLINIC_OR_DEPARTMENT_OTHER)
Admission: EM | Admit: 2012-04-10 | Discharge: 2012-04-10 | Disposition: A | Discharge status: Home / Self Care | Payer: Medicaid Other | Attending: Emergency Medicine | Admitting: Emergency Medicine

## 2012-04-10 DIAGNOSIS — Z7982 Long term (current) use of aspirin: Secondary | ICD-10-CM | POA: Insufficient documentation

## 2012-04-10 DIAGNOSIS — J3489 Other specified disorders of nose and nasal sinuses: Secondary | ICD-10-CM | POA: Insufficient documentation

## 2012-04-10 DIAGNOSIS — R509 Fever, unspecified: Secondary | ICD-10-CM | POA: Insufficient documentation

## 2012-04-10 DIAGNOSIS — F17200 Nicotine dependence, unspecified, uncomplicated: Secondary | ICD-10-CM | POA: Insufficient documentation

## 2012-04-10 DIAGNOSIS — J40 Bronchitis, not specified as acute or chronic: Secondary | ICD-10-CM | POA: Insufficient documentation

## 2012-04-10 DIAGNOSIS — F329 Major depressive disorder, single episode, unspecified: Secondary | ICD-10-CM | POA: Insufficient documentation

## 2012-04-10 DIAGNOSIS — Z79899 Other long term (current) drug therapy: Secondary | ICD-10-CM | POA: Insufficient documentation

## 2012-04-10 DIAGNOSIS — J45909 Unspecified asthma, uncomplicated: Secondary | ICD-10-CM | POA: Insufficient documentation

## 2012-04-10 DIAGNOSIS — F3289 Other specified depressive episodes: Secondary | ICD-10-CM | POA: Insufficient documentation

## 2012-04-10 MED ORDER — AMOXICILLIN 500 MG PO CAPS: 1000.0000 mg | ORAL_CAPSULE | Freq: Two times a day (BID) | ORAL | Status: DC

## 2012-04-10 MED ORDER — ALBUTEROL SULFATE HFA 108 (90 BASE) MCG/ACT IN AERS: 2.0000 | INHALATION_SPRAY | RESPIRATORY_TRACT | Status: DC | PRN

## 2012-04-10 MED ORDER — ALBUTEROL SULFATE (5 MG/ML) 0.5% IN NEBU
5.0000 mg | INHALATION_SOLUTION | Freq: Once | RESPIRATORY_TRACT | Status: AC
  Administered 2012-04-10: 5 mg via RESPIRATORY_TRACT
  Filled 2012-04-10: qty 1

## 2012-04-10 MED ORDER — IPRATROPIUM BROMIDE 0.02 % IN SOLN
0.5000 mg | Freq: Once | RESPIRATORY_TRACT | Status: AC
  Administered 2012-04-10: 0.5 mg via RESPIRATORY_TRACT
  Filled 2012-04-10: qty 2.5

## 2012-04-10 NOTE — ED Notes (Signed)
Patient back from radiology.

## 2012-04-10 NOTE — ED Provider Notes (Signed)
History     CSN: [id number]  Arrival date & time 04/10/12  1017   First MD Initiated Contact with Patient 04/10/12 1113      Chief Complaint  Patient presents with  . Cough  . Nasal Congestion  . Fever    (Consider location/radiation/quality/duration/timing/severity/associated sxs/prior treatment) Patient is a 36 y.o. female presenting with cough and fever. The history is provided by the patient.  Cough  Fever Primary symptoms of the febrile illness include fever and cough.  She has had a cold for the last week consisted of nasal congestion and a cough productive of yellowish to brownish sputum. She's run fevers as high as 102.4. There been no chills or sweats. She has noted some dyspnea with exertion and her chest has felt tight. There's been no nausea vomiting or diarrhea and no arthralgias or myalgias. She's taking over-the-counter medications with no benefit. She does smoke one pack of cigarettes a day.  Past Medical History  Diagnosis Date  . Asthma   . Depression     Past Surgical History  Procedure Date  . Cesarean section   . Tubal ligation     No family history on file.  History  Substance Use Topics  . Smoking status: Current Every Day Smoker -- 1.0 packs/day  . Smokeless tobacco: Not on file  . Alcohol Use: No    OB History    Grav Para Term Preterm Abortions TAB SAB Ect Mult Living                  Review of Systems  Constitutional: Positive for fever.  Respiratory: Positive for cough.   All other systems reviewed and are negative.    Allergies  Asa buff (mag; Peanut butter flavor; and Codeine  Home Medications   Current Outpatient Rx  Name  Route  Sig  Dispense  Refill  . IBUPROFEN 200 MG PO TABS   Oral   Take 200 mg by mouth every 6 (six) hours as needed.         . ACETAMINOPHEN 500 MG PO TABS   Oral   Take 1,000 mg by mouth as needed. For pain          . ALBUTEROL SULFATE HFA 108 (90 BASE) MCG/ACT IN AERS   Inhalation  Inhale 1-2 puffs into the lungs every 6 (six) hours as needed for wheezing.   1 Inhaler   0   . BENZOCAINE-MENTHOL 6-10 MG MT LOZG   Oral   Take 1 lozenge by mouth every 2 (two) hours as needed for pain.   30 tablet   0   . OXYCODONE-ACETAMINOPHEN 5-325 MG PO TABS   Oral   Take 1 tablet by mouth every 4 (four) hours as needed for pain.   8 tablet   0     BP 138/66  Pulse 74  Temp 98.3 F (36.8 C) (Oral)  SpO2 100%  LMP 04/06/2012  Physical Exam  Nursing note and vitals reviewed.  36 year old female, resting comfortably and in no acute distress. Vital signs are normal. Oxygen saturation is 100%, which is normal. Head is normocephalic and atraumatic. PERRLA, EOMI. Oropharynx is clear. Xanthelasmas are present. Neck is nontender and supple without adenopathy or JVD. Back is nontender and there is no CVA tenderness. Lungs are clear without rales, wheezes, or rhonchi. Slightly prolonged exhalation phase is noted and there is slight wheezing with forced exhalation. Chest is nontender. Heart has regular rate and rhythm without murmur.  Abdomen is soft, flat, nontender without masses or hepatosplenomegaly and peristalsis is normoactive. Extremities have no cyanosis or edema, full range of motion is present. Skin is warm and dry without rash. Neurologic: Mental status is normal, cranial nerves are intact, there are no motor or sensory deficits.  ED Course  Procedures (including critical care time)  Dg Chest 2 View  04/10/2012  *RADIOLOGY REPORT*  Clinical Data: Cough, congestion, fever  CHEST - 2 VIEW  Comparison: 03/07/2012  Findings: Cardiomediastinal silhouette is stable.  No acute infiltrate or pleural effusion.  No pulmonary edema.  Stable mild degenerative changes thoracic spine.  IMPRESSION: No active disease.  No significant change.   Original Report Authenticated By: Natasha Mead, M.D.      1. Bronchitis       MDM  Respiratory tract infection with bronchospasm.  Chest x-ray or be obtained to rule out pneumonia and she'll be given albuterol with Atrovent via nebulizer.  She got considerable relief of her cough with albuterol. She'll be sent home with prescriptions for amoxicillin and albuterol inhaler.  Dione Booze, MD 04/10/12 [phone]

## 2012-04-10 NOTE — ED Notes (Signed)
Pt c/o productive cough, nasal congestion, fever, and Rt "lung tightness" x 1 week.

## 2012-05-20 ENCOUNTER — Encounter (HOSPITAL_BASED_OUTPATIENT_CLINIC_OR_DEPARTMENT_OTHER): Payer: Self-pay | Admitting: Family Medicine

## 2012-05-20 ENCOUNTER — Emergency Department (HOSPITAL_BASED_OUTPATIENT_CLINIC_OR_DEPARTMENT_OTHER)
Admission: EM | Admit: 2012-05-20 | Discharge: 2012-05-20 | Disposition: A | Discharge status: Home / Self Care | Payer: Medicaid Other | Attending: Emergency Medicine | Admitting: Emergency Medicine

## 2012-05-20 DIAGNOSIS — Z8659 Personal history of other mental and behavioral disorders: Secondary | ICD-10-CM | POA: Insufficient documentation

## 2012-05-20 DIAGNOSIS — Z79899 Other long term (current) drug therapy: Secondary | ICD-10-CM | POA: Insufficient documentation

## 2012-05-20 DIAGNOSIS — H109 Unspecified conjunctivitis: Secondary | ICD-10-CM | POA: Insufficient documentation

## 2012-05-20 DIAGNOSIS — F17200 Nicotine dependence, unspecified, uncomplicated: Secondary | ICD-10-CM | POA: Insufficient documentation

## 2012-05-20 DIAGNOSIS — J45909 Unspecified asthma, uncomplicated: Secondary | ICD-10-CM | POA: Insufficient documentation

## 2012-05-20 MED ORDER — TOBRAMYCIN 0.3 % OP SOLN: 1.0000 [drp] | OPHTHALMIC | Status: DC

## 2012-05-20 NOTE — ED Notes (Addendum)
Pt c/o bilateral eye drainage, itching and burning x 5 days. Pt reports normal vision.

## 2012-05-20 NOTE — ED Provider Notes (Signed)
History     CSN: [id number]  Arrival date & time 05/20/12  0902   First MD Initiated Contact with Patient 05/20/12 [phone]      Chief Complaint  Patient presents with  . Eye Drainage    (Consider location/radiation/quality/duration/timing/severity/associated sxs/prior treatment) HPI Comments: Patient presents with a one-week history of irritation and matting of both eyes. She has 2 other family members with conjunctivitis. She denies any visual changes or eye pain. Denies he fevers. She had recent cold symptoms. She denies any headache or vision changes. Her symptoms had started getting better but it started matting again.   Past Medical History  Diagnosis Date  . Asthma   . Depression     Past Surgical History  Procedure Date  . Cesarean section   . Tubal ligation     No family history on file.  History  Substance Use Topics  . Smoking status: Current Every Day Smoker -- 1.0 packs/day  . Smokeless tobacco: Not on file  . Alcohol Use: No    OB History    Grav Para Term Preterm Abortions TAB SAB Ect Mult Living                  Review of Systems  Constitutional: Negative for fever, chills, diaphoresis and fatigue.  HENT: Negative for congestion, rhinorrhea and sneezing.   Eyes: Positive for discharge and redness. Negative for visual disturbance.  Respiratory: Negative for cough, chest tightness and shortness of breath.   Cardiovascular: Negative for chest pain and leg swelling.  Gastrointestinal: Negative for nausea, vomiting, abdominal pain, diarrhea and blood in stool.  Genitourinary: Negative for frequency, hematuria, flank pain and difficulty urinating.  Musculoskeletal: Negative for back pain and arthralgias.  Skin: Negative for rash.  Neurological: Negative for dizziness, speech difficulty, weakness, numbness and headaches.    Allergies  Asa buff (mag; Peanut butter flavor; and Codeine  Home Medications   Current Outpatient Rx  Name  Route  Sig   Dispense  Refill  . ACETAMINOPHEN 500 MG PO TABS   Oral   Take 1,000 mg by mouth as needed. For pain          . ALBUTEROL SULFATE HFA 108 (90 BASE) MCG/ACT IN AERS   Inhalation   Inhale 1-2 puffs into the lungs every 6 (six) hours as needed for wheezing.   1 Inhaler   0   . ALBUTEROL SULFATE HFA 108 (90 BASE) MCG/ACT IN AERS   Inhalation   Inhale 2 puffs into the lungs every 4 (four) hours as needed for wheezing (or coughing).   1 Inhaler   0   . AMOXICILLIN 500 MG PO CAPS   Oral   Take 2 capsules (1,000 mg total) by mouth 2 (two) times daily.   40 capsule   0   . BENZOCAINE-MENTHOL 6-10 MG MT LOZG   Oral   Take 1 lozenge by mouth every 2 (two) hours as needed for pain.   30 tablet   0   . IBUPROFEN 200 MG PO TABS   Oral   Take 200 mg by mouth every 6 (six) hours as needed.         . OXYCODONE-ACETAMINOPHEN 5-325 MG PO TABS   Oral   Take 1 tablet by mouth every 4 (four) hours as needed for pain.   8 tablet   0   . TOBRAMYCIN SULFATE 0.3 % OP SOLN   Both Eyes   Place 1 drop into both eyes every  4 (four) hours.   5 mL   0     BP 126/73  Pulse 92  Temp 98.6 F (37 C) (Oral)  Resp 14  SpO2 99%  LMP 04/22/2012  Physical Exam  Constitutional: She is oriented to person, place, and time. She appears well-developed and well-nourished.  HENT:  Head: Normocephalic and atraumatic.  Eyes: Pupils are equal, round, and reactive to light.       Patient has mild erythema of both eyes with some yellow matting. There is no erythema around the eyes. There is no pain with movement of the eyes. No photophobia  Neck: Normal range of motion. Neck supple.  Cardiovascular: Normal rate, regular rhythm and normal heart sounds.   Pulmonary/Chest: Effort normal and breath sounds normal. No respiratory distress. She has no wheezes. She has no rales. She exhibits no tenderness.  Abdominal: Soft. Bowel sounds are normal. There is no tenderness. There is no rebound and no guarding.   Musculoskeletal: Normal range of motion. She exhibits no edema.  Lymphadenopathy:    She has no cervical adenopathy.  Neurological: She is alert and oriented to person, place, and time.  Skin: Skin is warm and dry. No rash noted.  Psychiatric: She has a normal mood and affect.    ED Course  Procedures (including critical care time)  Labs Reviewed - No data to display No results found.   1. Conjunctivitis       MDM  Patient with likely conjunctivitis. She has 2 daughters with conjunctivitis. I gave her prescription for Vigamox eyedrops. She denies any vision changes. I advised her to followup with an ophthalmologist who I give her referral to his no better within the next 2-3 days return here as needed for any worsening symptoms.        Rolan Bucco, MD 05/20/12 1501

## 2012-07-05 ENCOUNTER — Emergency Department (HOSPITAL_BASED_OUTPATIENT_CLINIC_OR_DEPARTMENT_OTHER)
Admission: EM | Admit: 2012-07-05 | Discharge: 2012-07-05 | Disposition: A | Discharge status: Home / Self Care | Payer: Medicaid Other | Attending: Emergency Medicine | Admitting: Emergency Medicine

## 2012-07-05 ENCOUNTER — Encounter (HOSPITAL_BASED_OUTPATIENT_CLINIC_OR_DEPARTMENT_OTHER): Payer: Self-pay | Admitting: Emergency Medicine

## 2012-07-05 DIAGNOSIS — L03119 Cellulitis of unspecified part of limb: Secondary | ICD-10-CM | POA: Insufficient documentation

## 2012-07-05 DIAGNOSIS — L02419 Cutaneous abscess of limb, unspecified: Secondary | ICD-10-CM | POA: Insufficient documentation

## 2012-07-05 DIAGNOSIS — F17200 Nicotine dependence, unspecified, uncomplicated: Secondary | ICD-10-CM | POA: Insufficient documentation

## 2012-07-05 DIAGNOSIS — Z8659 Personal history of other mental and behavioral disorders: Secondary | ICD-10-CM | POA: Insufficient documentation

## 2012-07-05 DIAGNOSIS — L0291 Cutaneous abscess, unspecified: Secondary | ICD-10-CM

## 2012-07-05 DIAGNOSIS — J45909 Unspecified asthma, uncomplicated: Secondary | ICD-10-CM | POA: Insufficient documentation

## 2012-07-05 DIAGNOSIS — R5381 Other malaise: Secondary | ICD-10-CM | POA: Insufficient documentation

## 2012-07-05 MED ORDER — OXYCODONE-ACETAMINOPHEN 5-325 MG PO TABS
2.0000 | ORAL_TABLET | Freq: Once | ORAL | Status: AC
  Administered 2012-07-05: 2 via ORAL
  Filled 2012-07-05 (×2): qty 2

## 2012-07-05 MED ORDER — OXYCODONE-ACETAMINOPHEN 5-325 MG PO TABS: 1.0000 | ORAL_TABLET | ORAL | Status: DC | PRN

## 2012-07-05 NOTE — ED Provider Notes (Signed)
History     CSN: [id number]  Arrival date & time 07/05/12  1248   First MD Initiated Contact with Patient 07/05/12 1408      Chief Complaint  Patient presents with  . abcess     (Consider location/radiation/quality/duration/timing/severity/associated sxs/prior treatment) HPI  Patient complaining of swelling, redness pain left medial thigh.  States larger than before but no drainage.  NO fever, chills, or vomiting.  She had had some similar symptoms in past but has not required i and d or antibiotics.    Past Medical History  Diagnosis Date  . Asthma   . Depression     Past Surgical History  Procedure Laterality Date  . Cesarean section    . Tubal ligation      History reviewed. No pertinent family history.  History  Substance Use Topics  . Smoking status: Current Every Day Smoker -- 1.00 packs/day  . Smokeless tobacco: Not on file  . Alcohol Use: No    OB History   Grav Para Term Preterm Abortions TAB SAB Ect Mult Living                  Review of Systems  All other systems reviewed and are negative.    Allergies  Asa buff (mag; Peanut butter flavor; and Codeine  Home Medications   Current Outpatient Rx  Name  Route  Sig  Dispense  Refill  . acetaminophen (TYLENOL) 500 MG tablet   Oral   Take 1,000 mg by mouth as needed. For pain          . ibuprofen (ADVIL,MOTRIN) 200 MG tablet   Oral   Take 200 mg by mouth every 6 (six) hours as needed.           BP 130/85  Pulse 90  Temp(Src) 98.1 F (36.7 C) (Oral)  Resp 18  Ht 4\' 11"  (1.499 m)  Wt 211 lb (95.709 kg)  BMI 42.59 kg/m2  SpO2 100%  LMP 06/23/2012  Physical Exam  Nursing note and vitals reviewed. Constitutional: She appears well-developed and well-nourished.  Morbidly obese   HENT:  Head: Normocephalic and atraumatic.  Eyes: Pupils are equal, round, and reactive to light.  Cardiovascular: Normal rate.   Pulmonary/Chest: Effort normal.  Abdominal: Soft.  Musculoskeletal:  Normal range of motion.  Neurological: She is alert.  Skin: Skin is warm.  Erythematous indurated area medial left thigh 2 x 3 cm.    Psychiatric: She has a normal mood and affect.    ED Course  INCISION AND DRAINAGE Date/Time: 07/05/2012 2:21 PM Performed by: Hilario Quarry Authorized by: Hilario Quarry Consent: Verbal consent obtained. Risks and benefits: risks, benefits and alternatives were discussed Consent given by: patient Patient identity confirmed: verbally with patient Type: abscess Anesthesia: local infiltration Local anesthetic: lidocaine 1% with epinephrine Anesthetic total: 2 ml Patient sedated: no Scalpel size: 11 Needle gauge: 22 Incision type: single straight Complexity: simple Drainage: purulent Drainage amount: scant Wound treatment: wound left open Patient tolerance: Patient tolerated the procedure well with no immediate complications.   (including critical care time)  Labs Reviewed - No data to display No results found.   No diagnosis found.    MDM     Patient with small sq abscess i and d'd.  Plan pain medicine and frequent soaks.  Patient advised.      Hilario Quarry, MD 07/05/12 [phone]

## 2012-07-05 NOTE — ED Notes (Signed)
Pt states she has a boil on her left groin next to thigh for over one week.  Pt states it is painful and seems to becoming larger.  No significant drainage.

## 2012-07-05 NOTE — ED Notes (Signed)
I & D tray is set up and ready for the doctor to use.

## 2012-07-07 NOTE — ED Provider Notes (Signed)
History     CSN: [id number]  Arrival date & time 07/05/12  1248   First MD Initiated Contact with Patient 07/05/12 1408      Chief Complaint  Patient presents with  . abcess     (Consider location/radiation/quality/duration/timing/severity/associated sxs/prior treatment) Patient is a 37 y.o. female presenting with abscess. The history is provided by the patient.  Abscess Location:  Ano-genital and leg Leg abscess location:  L upper leg Abscess quality: induration, painful, redness and warmth   Abscess quality: not draining, no itching and not weeping   Red streaking: no   Progression:  Worsening Pain details:    Quality:  Throbbing   Severity:  Moderate   Timing:  Constant   Progression:  Worsening Chronicity:  Recurrent Context: not diabetes, not immunosuppression, not injected drug use, not insect bite/sting and not skin injury   Relieved by:  Draining/squeezing Worsened by:  Nothing tried Ineffective treatments:  NSAIDs Associated symptoms: fatigue   Associated symptoms: no fever and no vomiting   Risk factors: prior abscess     Past Medical History  Diagnosis Date  . Asthma   . Depression     Past Surgical History  Procedure Laterality Date  . Cesarean section    . Tubal ligation      History reviewed. No pertinent family history.  History  Substance Use Topics  . Smoking status: Current Every Day Smoker -- 1.00 packs/day  . Smokeless tobacco: Not on file  . Alcohol Use: No    OB History   Grav Para Term Preterm Abortions TAB SAB Ect Mult Living                  Review of Systems  Constitutional: Positive for fatigue. Negative for fever.  Gastrointestinal: Negative for vomiting.  All other systems reviewed and are negative.    Allergies  Asa buff (mag; Peanut butter flavor; and Codeine  Home Medications   Current Outpatient Rx  Name  Route  Sig  Dispense  Refill  . acetaminophen (TYLENOL) 500 MG tablet   Oral   Take 1,000 mg by mouth  as needed. For pain          . ibuprofen (ADVIL,MOTRIN) 200 MG tablet   Oral   Take 200 mg by mouth every 6 (six) hours as needed.         Marland Kitchen oxyCODONE-acetaminophen (PERCOCET/ROXICET) 5-325 MG per tablet   Oral   Take 1 tablet by mouth every 4 (four) hours as needed for pain.   15 tablet   0     BP 130/85  Pulse 90  Temp(Src) 98.1 F (36.7 C) (Oral)  Resp 18  Ht 4\' 11"  (1.499 m)  Wt 211 lb (95.709 kg)  BMI 42.59 kg/m2  SpO2 100%  LMP 06/23/2012  Physical Exam  Nursing note and vitals reviewed. Constitutional: She appears well-developed and well-nourished.  obese  HENT:  Head: Normocephalic and atraumatic.  Eyes: Pupils are equal, round, and reactive to light.  Neck: Normal range of motion. Neck supple.  Cardiovascular: Normal rate.   Pulmonary/Chest: Effort normal.  Abdominal: Soft.  Musculoskeletal: Normal range of motion.  Skin: Skin is warm and dry.  2x2 cm inflamed erythematous lesion left medial proximal thigh  Psychiatric: She has a normal mood and affect.    ED Course  INCISION AND DRAINAGE Date/Time: 07/05/2012 3:47 PM Performed by: Hilario Quarry Authorized by: Hilario Quarry Consent: Verbal consent obtained. Risks and benefits: risks, benefits and  alternatives were discussed Consent given by: patient Time out: Immediately prior to procedure a "time out" was called to verify the correct patient, procedure, equipment, support staff and site/side marked as required. Type: abscess Body area: lower extremity Anesthesia: local infiltration Local anesthetic: lidocaine 1% with epinephrine Anesthetic total: 2 ml Patient sedated: no Scalpel size: 11 Incision type: single straight Drainage: serosanguinous Drainage amount: scant Wound treatment: wound left open Patient tolerance: Patient tolerated the procedure well with no immediate complications.   (including critical care time)  Labs Reviewed - No data to display No results found.   1.  Abscess       MDM          Hilario Quarry, MD 07/07/12 [phone]

## 2012-09-01 ENCOUNTER — Emergency Department (HOSPITAL_BASED_OUTPATIENT_CLINIC_OR_DEPARTMENT_OTHER)
Admission: EM | Admit: 2012-09-01 | Discharge: 2012-09-01 | Disposition: A | Discharge status: Home / Self Care | Payer: Medicaid Other | Attending: Emergency Medicine | Admitting: Emergency Medicine

## 2012-09-01 ENCOUNTER — Emergency Department (HOSPITAL_BASED_OUTPATIENT_CLINIC_OR_DEPARTMENT_OTHER): Payer: Medicaid Other

## 2012-09-01 ENCOUNTER — Encounter (HOSPITAL_BASED_OUTPATIENT_CLINIC_OR_DEPARTMENT_OTHER): Payer: Self-pay | Admitting: *Deleted

## 2012-09-01 DIAGNOSIS — M25519 Pain in unspecified shoulder: Secondary | ICD-10-CM | POA: Insufficient documentation

## 2012-09-01 DIAGNOSIS — F17200 Nicotine dependence, unspecified, uncomplicated: Secondary | ICD-10-CM | POA: Insufficient documentation

## 2012-09-01 DIAGNOSIS — IMO0001 Reserved for inherently not codable concepts without codable children: Secondary | ICD-10-CM | POA: Insufficient documentation

## 2012-09-01 DIAGNOSIS — M2550 Pain in unspecified joint: Secondary | ICD-10-CM | POA: Insufficient documentation

## 2012-09-01 DIAGNOSIS — M25511 Pain in right shoulder: Secondary | ICD-10-CM

## 2012-09-01 DIAGNOSIS — J45909 Unspecified asthma, uncomplicated: Secondary | ICD-10-CM | POA: Insufficient documentation

## 2012-09-01 DIAGNOSIS — Z8659 Personal history of other mental and behavioral disorders: Secondary | ICD-10-CM | POA: Insufficient documentation

## 2012-09-01 MED ORDER — OXYCODONE-ACETAMINOPHEN 5-325 MG PO TABS: 2.0000 | ORAL_TABLET | ORAL | Status: DC | PRN

## 2012-09-01 MED ORDER — IBUPROFEN 800 MG PO TABS: 800.0000 mg | ORAL_TABLET | Freq: Three times a day (TID) | ORAL | Status: DC

## 2012-09-01 NOTE — ED Provider Notes (Signed)
History     CSN: [id number]  Arrival date & time 09/01/12  1651   First MD Initiated Contact with Patient 09/01/12 1746      Chief Complaint  Patient presents with  . Shoulder Pain    (Consider location/radiation/quality/duration/timing/severity/associated sxs/prior treatment) Patient is a 37 y.o. female presenting with shoulder pain. The history is provided by the patient. No language interpreter was used.  Shoulder Pain This is a new problem. The current episode started yesterday. The problem occurs constantly. The problem has been gradually worsening. Associated symptoms include joint swelling and myalgias. Nothing aggravates the symptoms. She has tried nothing for the symptoms. The treatment provided moderate relief.  Pt complains of pain in her right shoulder after pulling on limbs yesterday.  No fall, no twisting or pulling injury  Past Medical History  Diagnosis Date  . Asthma   . Depression     Past Surgical History  Procedure Laterality Date  . Cesarean section    . Tubal ligation      History reviewed. No pertinent family history.  History  Substance Use Topics  . Smoking status: Current Every Day Smoker -- 1.00 packs/day  . Smokeless tobacco: Not on file  . Alcohol Use: No    OB History   Grav Para Term Preterm Abortions TAB SAB Ect Mult Living                  Review of Systems  Musculoskeletal: Positive for myalgias and joint swelling.  All other systems reviewed and are negative.    Allergies  Asa buff (mag; Peanut butter flavor; and Codeine  Home Medications   Current Outpatient Rx  Name  Route  Sig  Dispense  Refill  . acetaminophen (TYLENOL) 500 MG tablet   Oral   Take 1,000 mg by mouth as needed. For pain          . ibuprofen (ADVIL,MOTRIN) 200 MG tablet   Oral   Take 200 mg by mouth every 6 (six) hours as needed.         Marland Kitchen oxyCODONE-acetaminophen (PERCOCET/ROXICET) 5-325 MG per tablet   Oral   Take 1 tablet by mouth every 4  (four) hours as needed for pain.   15 tablet   0     BP 117/52  Pulse 92  Temp(Src) 97.7 F (36.5 C) (Oral)  Resp 20  SpO2 99%  LMP 08/14/2012  Physical Exam  Nursing note and vitals reviewed. Constitutional: She is oriented to person, place, and time. She appears well-developed and well-nourished.  HENT:  Head: Normocephalic.  Cardiovascular: Normal rate and regular rhythm.   Pulmonary/Chest: Effort normal and breath sounds normal.  Musculoskeletal: She exhibits tenderness. She exhibits no edema.  Pain to palpation right shoulder,  Decreased range of motion,  nv and ns intact  Neurological: She is alert and oriented to person, place, and time. She has normal reflexes.  Skin: Skin is warm.    ED Course  Procedures (including critical care time)  Labs Reviewed - No data to display Dg Shoulder Right  09/01/2012  *RADIOLOGY REPORT*  Clinical Data: Right shoulder pain and stiffness  RIGHT SHOULDER - 2+ VIEW  Comparison: None.  Findings: The right humeral head is in normal position within the glenohumeral joint space.  No fracture is seen.  The [facility] joint is normally aligned.  The ribs that are visualized are intact.  IMPRESSION: Negative.   Original Report Authenticated By: Dwyane Dee, M.D.  No diagnosis found.    MDM  Ibuprofen and hydrocodone        Lonia Skinner [place], [place] 09/01/12 1806

## 2012-09-01 NOTE — ED Notes (Signed)
Right shoulder pain after moving limbs in the yard yesterday getting more painful and stiff today

## 2012-09-01 NOTE — ED Provider Notes (Signed)
Medical screening examination/treatment/procedure(s) were performed by non-physician practitioner and as supervising physician I was immediately available for consultation/collaboration.   Carleene Cooper III, MD 09/01/12 [phone]

## 2012-11-14 ENCOUNTER — Encounter (HOSPITAL_BASED_OUTPATIENT_CLINIC_OR_DEPARTMENT_OTHER): Payer: Self-pay | Admitting: *Deleted

## 2012-11-14 ENCOUNTER — Emergency Department (HOSPITAL_BASED_OUTPATIENT_CLINIC_OR_DEPARTMENT_OTHER)
Admission: EM | Admit: 2012-11-14 | Discharge: 2012-11-14 | Disposition: A | Discharge status: Home / Self Care | Payer: Medicaid Other | Attending: Emergency Medicine | Admitting: Emergency Medicine

## 2012-11-14 DIAGNOSIS — J020 Streptococcal pharyngitis: Secondary | ICD-10-CM | POA: Insufficient documentation

## 2012-11-14 DIAGNOSIS — R5381 Other malaise: Secondary | ICD-10-CM | POA: Insufficient documentation

## 2012-11-14 DIAGNOSIS — R1310 Dysphagia, unspecified: Secondary | ICD-10-CM | POA: Insufficient documentation

## 2012-11-14 DIAGNOSIS — M2550 Pain in unspecified joint: Secondary | ICD-10-CM | POA: Insufficient documentation

## 2012-11-14 DIAGNOSIS — Z8659 Personal history of other mental and behavioral disorders: Secondary | ICD-10-CM | POA: Insufficient documentation

## 2012-11-14 DIAGNOSIS — F17200 Nicotine dependence, unspecified, uncomplicated: Secondary | ICD-10-CM | POA: Insufficient documentation

## 2012-11-14 DIAGNOSIS — IMO0001 Reserved for inherently not codable concepts without codable children: Secondary | ICD-10-CM | POA: Insufficient documentation

## 2012-11-14 DIAGNOSIS — J45909 Unspecified asthma, uncomplicated: Secondary | ICD-10-CM | POA: Insufficient documentation

## 2012-11-14 DIAGNOSIS — R000 Tachycardia, unspecified: Secondary | ICD-10-CM | POA: Insufficient documentation

## 2012-11-14 DIAGNOSIS — R599 Enlarged lymph nodes, unspecified: Secondary | ICD-10-CM | POA: Insufficient documentation

## 2012-11-14 DIAGNOSIS — R509 Fever, unspecified: Secondary | ICD-10-CM | POA: Insufficient documentation

## 2012-11-14 DIAGNOSIS — R110 Nausea: Secondary | ICD-10-CM | POA: Insufficient documentation

## 2012-11-14 DIAGNOSIS — M542 Cervicalgia: Secondary | ICD-10-CM | POA: Insufficient documentation

## 2012-11-14 MED ORDER — PENICILLIN V POTASSIUM 250 MG PO TABS: 250.0000 mg | ORAL_TABLET | Freq: Four times a day (QID) | ORAL | Status: AC

## 2012-11-14 MED ORDER — HYDROCODONE-ACETAMINOPHEN 5-325 MG PO TABS
1.0000 | ORAL_TABLET | Freq: Once | ORAL | Status: AC
  Administered 2012-11-14: 1 via ORAL
  Filled 2012-11-14: qty 1

## 2012-11-14 MED ORDER — DEXAMETHASONE SODIUM PHOSPHATE 10 MG/ML IJ SOLN
10.0000 mg | Freq: Once | INTRAMUSCULAR | Status: AC
  Administered 2012-11-14: 10 mg via INTRAMUSCULAR
  Filled 2012-11-14: qty 1

## 2012-11-14 MED ORDER — HYDROCODONE-ACETAMINOPHEN 5-325 MG PO TABS: 1.0000 | ORAL_TABLET | ORAL | Status: DC | PRN

## 2012-11-14 NOTE — ED Provider Notes (Signed)
History     CSN: [id number]  Arrival date & time 11/14/12  2047   First MD Initiated Contact with Patient 11/14/12 2101      Chief Complaint  Patient presents with  . Sore Throat    (Consider location/radiation/quality/duration/timing/severity/associated sxs/prior treatment) HPI Pt with 1 day of sore throat, fever, chills, myalgias, nausea, cervical adenopathy. No difficulty breathing. +pain with swallowing but tolerating fluids.  Past Medical History  Diagnosis Date  . Asthma   . Depression     Past Surgical History  Procedure Laterality Date  . Cesarean section    . Tubal ligation      No family history on file.  History  Substance Use Topics  . Smoking status: Current Every Day Smoker -- 1.00 packs/day  . Smokeless tobacco: Not on file  . Alcohol Use: No    OB History   Grav Para Term Preterm Abortions TAB SAB Ect Mult Living                  Review of Systems  Constitutional: Positive for fever, chills and fatigue.  HENT: Positive for sore throat, trouble swallowing and neck pain. Negative for congestion, rhinorrhea, neck stiffness and voice change.   Respiratory: Negative for shortness of breath and wheezing.   Cardiovascular: Negative for chest pain, palpitations and leg swelling.  Gastrointestinal: Positive for nausea. Negative for vomiting, abdominal pain, diarrhea and constipation.  Musculoskeletal: Positive for myalgias and arthralgias. Negative for back pain.  Skin: Negative for rash and wound.  Neurological: Negative for dizziness, weakness, light-headedness, numbness and headaches.  All other systems reviewed and are negative.    Allergies  Asa buff (mag; Peanut butter flavor; and Codeine  Home Medications   Current Outpatient Rx  Name  Route  Sig  Dispense  Refill  . acetaminophen (TYLENOL) 500 MG tablet   Oral   Take 1,000 mg by mouth as needed. For pain          . HYDROcodone-acetaminophen (NORCO) 5-325 MG per tablet   Oral  Take 1 tablet by mouth every 4 (four) hours as needed for pain.   10 tablet   0   . ibuprofen (ADVIL,MOTRIN) 200 MG tablet   Oral   Take 200 mg by mouth every 6 (six) hours as needed.         Marland Kitchen ibuprofen (ADVIL,MOTRIN) 800 MG tablet   Oral   Take 1 tablet (800 mg total) by mouth 3 (three) times daily.   21 tablet   0   . oxyCODONE-acetaminophen (PERCOCET/ROXICET) 5-325 MG per tablet   Oral   Take 1 tablet by mouth every 4 (four) hours as needed for pain.   15 tablet   0   . oxyCODONE-acetaminophen (PERCOCET/ROXICET) 5-325 MG per tablet   Oral   Take 2 tablets by mouth every 4 (four) hours as needed for pain.   16 tablet   0   . penicillin v potassium (VEETID) 250 MG tablet   Oral   Take 1 tablet (250 mg total) by mouth 4 (four) times daily.   40 tablet   0     BP 146/77  Pulse 113  Temp(Src) 99.9 F (37.7 C) (Oral)  Resp 22  Ht 4\' 11"  (1.499 m)  Wt 228 lb (103.42 kg)  BMI 46.03 kg/m2  SpO2 98%  Physical Exam  Nursing note and vitals reviewed. Constitutional: She is oriented to person, place, and time. She appears well-developed and well-nourished. No distress.  HENT:  Head: Normocephalic and atraumatic.  Mouth/Throat: Oropharyngeal exudate present.  bl tonsillar enlargement. +exudates. Uvula midline.   Eyes: EOM are normal. Pupils are equal, round, and reactive to light.  Neck: Normal range of motion. Neck supple.  Cardiovascular: Regular rhythm.   tachycardia  Pulmonary/Chest: Effort normal and breath sounds normal. No respiratory distress. She has no wheezes. She has no rales.  Abdominal: Soft. Bowel sounds are normal. She exhibits no distension and no mass. There is no tenderness. There is no rebound and no guarding.  Musculoskeletal: Normal range of motion. She exhibits no edema and no tenderness.  Lymphadenopathy:    She has cervical adenopathy.  Neurological: She is alert and oriented to person, place, and time.  Skin: Skin is warm and dry. No rash  noted. No erythema.  Psychiatric: She has a normal mood and affect. Her behavior is normal.    ED Course  Procedures (including critical care time)  Labs Reviewed  RAPID STREP SCREEN - Abnormal; Notable for the following:    Streptococcus, Group A Screen (Direct) POSITIVE (*)    All other components within normal limits   No results found.   1. Strep throat       MDM          Loren Racer, MD 11/14/12 2222

## 2012-11-14 NOTE — ED Notes (Signed)
Sore throat. States she thinks she has strep throat.

## 2012-12-13 ENCOUNTER — Emergency Department (HOSPITAL_BASED_OUTPATIENT_CLINIC_OR_DEPARTMENT_OTHER)
Admission: EM | Admit: 2012-12-13 | Discharge: 2012-12-13 | Disposition: A | Discharge status: Home / Self Care | Payer: Medicaid Other | Attending: Emergency Medicine | Admitting: Emergency Medicine

## 2012-12-13 ENCOUNTER — Encounter (HOSPITAL_BASED_OUTPATIENT_CLINIC_OR_DEPARTMENT_OTHER): Payer: Self-pay | Admitting: *Deleted

## 2012-12-13 DIAGNOSIS — Y93E1 Activity, personal bathing and showering: Secondary | ICD-10-CM | POA: Insufficient documentation

## 2012-12-13 DIAGNOSIS — J45909 Unspecified asthma, uncomplicated: Secondary | ICD-10-CM | POA: Insufficient documentation

## 2012-12-13 DIAGNOSIS — S3981XA Other specified injuries of abdomen, initial encounter: Secondary | ICD-10-CM | POA: Insufficient documentation

## 2012-12-13 DIAGNOSIS — Z8659 Personal history of other mental and behavioral disorders: Secondary | ICD-10-CM | POA: Insufficient documentation

## 2012-12-13 DIAGNOSIS — IMO0002 Reserved for concepts with insufficient information to code with codable children: Secondary | ICD-10-CM | POA: Insufficient documentation

## 2012-12-13 DIAGNOSIS — W1809XA Striking against other object with subsequent fall, initial encounter: Secondary | ICD-10-CM | POA: Insufficient documentation

## 2012-12-13 DIAGNOSIS — F17200 Nicotine dependence, unspecified, uncomplicated: Secondary | ICD-10-CM | POA: Insufficient documentation

## 2012-12-13 DIAGNOSIS — M549 Dorsalgia, unspecified: Secondary | ICD-10-CM

## 2012-12-13 DIAGNOSIS — Z3202 Encounter for pregnancy test, result negative: Secondary | ICD-10-CM | POA: Insufficient documentation

## 2012-12-13 DIAGNOSIS — Y929 Unspecified place or not applicable: Secondary | ICD-10-CM | POA: Insufficient documentation

## 2012-12-13 LAB — URINALYSIS, ROUTINE W REFLEX MICROSCOPIC
Ketones, ur: NEGATIVE mg/dL
Leukocytes, UA: NEGATIVE
Nitrite: NEGATIVE
Protein, ur: NEGATIVE mg/dL
pH: 7 (ref 5.0–8.0)

## 2012-12-13 LAB — PREGNANCY, URINE: Preg Test, Ur: NEGATIVE

## 2012-12-13 MED ORDER — TRAMADOL HCL 50 MG PO TABS
50.0000 mg | ORAL_TABLET | Freq: Once | ORAL | Status: AC
  Administered 2012-12-13: 50 mg via ORAL
  Filled 2012-12-13: qty 1

## 2012-12-13 MED ORDER — TRAMADOL HCL 50 MG PO TABS: 50.0000 mg | ORAL_TABLET | Freq: Four times a day (QID) | ORAL | Status: DC | PRN

## 2012-12-13 MED ORDER — METHOCARBAMOL 500 MG PO TABS: 1000.0000 mg | ORAL_TABLET | Freq: Four times a day (QID) | ORAL | Status: DC

## 2012-12-13 MED ORDER — CYCLOBENZAPRINE HCL 10 MG PO TABS
5.0000 mg | ORAL_TABLET | Freq: Once | ORAL | Status: AC
  Administered 2012-12-13: 5 mg via ORAL
  Filled 2012-12-13: qty 1

## 2012-12-13 NOTE — ED Provider Notes (Signed)
Medical screening examination/treatment/procedure(s) were performed by non-physician practitioner and as supervising physician I was immediately available for consultation/collaboration.    Nelia Shi, MD 12/13/12 2117

## 2012-12-13 NOTE — ED Notes (Signed)
Pt reports she was taking a shower on wed and fell and hit her back on a shower chair

## 2012-12-13 NOTE — ED Notes (Signed)
PA at bedside.

## 2012-12-13 NOTE — ED Provider Notes (Signed)
History    CSN: [id number] Arrival date & time 12/13/12  1707  First MD Initiated Contact with Patient 12/13/12 1839     Chief Complaint  Patient presents with  . Back Injury   (Consider location/radiation/quality/duration/timing/severity/associated sxs/prior Treatment) HPI Comments: Patient presents with complaint of back pain that began acutely after a fall 3 nights ago. Patient was in the bathtub and slipped and struck the right lower back on a shower chair. Pain began immediately after falling. Patient states that 2 days ago the pain began to extend around her right side into her lower abdomen. She denies head injury. She denies red flag signs and symptoms of lower back pain. She denies blood in her urine or blood in her stool. No change in appetite. No fevers, vomiting, urinary symptoms. Patient took Midol for pain without relief. She is ambulatory with pain. Certain positions such as sitting up makes the pain hurt worse. Resting and lying flat makes the pain feel better.  The history is provided by the patient.   Past Medical History  Diagnosis Date  . Asthma   . Depression    Past Surgical History  Procedure Laterality Date  . Cesarean section    . Tubal ligation     No family history on file. History  Substance Use Topics  . Smoking status: Current Every Day Smoker -- 1.00 packs/day    Types: Cigarettes  . Smokeless tobacco: Never Used  . Alcohol Use: No   OB History   Grav Para Term Preterm Abortions TAB SAB Ect Mult Living                 Review of Systems  Constitutional: Negative for fever and unexpected weight change.  HENT: Negative for sore throat and rhinorrhea.   Eyes: Negative for redness.  Respiratory: Negative for cough.   Cardiovascular: Negative for chest pain.  Gastrointestinal: Positive for abdominal pain. Negative for nausea, vomiting, diarrhea and constipation.       Negative for fecal incontinence.   Genitourinary: Negative for dysuria,  hematuria, flank pain, vaginal bleeding, vaginal discharge and pelvic pain.       Negative for urinary incontinence or retention.  Musculoskeletal: Positive for back pain. Negative for myalgias.  Skin: Negative for rash.  Neurological: Negative for weakness, numbness and headaches.       Denies saddle paresthesias.    Allergies  Asa buff (mag; Peanut butter flavor; and Codeine  Home Medications   Current Outpatient Rx  Name  Route  Sig  Dispense  Refill  . acetaminophen (TYLENOL) 500 MG tablet   Oral   Take 1,000 mg by mouth as needed. For pain          . HYDROcodone-acetaminophen (NORCO) 5-325 MG per tablet   Oral   Take 1 tablet by mouth every 4 (four) hours as needed for pain.   10 tablet   0   . ibuprofen (ADVIL,MOTRIN) 200 MG tablet   Oral   Take 200 mg by mouth every 6 (six) hours as needed.         Marland Kitchen ibuprofen (ADVIL,MOTRIN) 800 MG tablet   Oral   Take 1 tablet (800 mg total) by mouth 3 (three) times daily.   21 tablet   0   . oxyCODONE-acetaminophen (PERCOCET/ROXICET) 5-325 MG per tablet   Oral   Take 1 tablet by mouth every 4 (four) hours as needed for pain.   15 tablet   0   . oxyCODONE-acetaminophen (PERCOCET/ROXICET)  5-325 MG per tablet   Oral   Take 2 tablets by mouth every 4 (four) hours as needed for pain.   16 tablet   0    BP 133/77  Pulse 93  Temp(Src) 97.7 F (36.5 C) (Oral)  Resp 18  Ht 4\' 11"  (1.499 m)  Wt 220 lb (99.791 kg)  BMI 44.41 kg/m2  SpO2 100%  LMP 11/29/2012  Physical Exam  Nursing note and vitals reviewed. Constitutional: She appears well-developed and well-nourished.  HENT:  Head: Normocephalic and atraumatic.  Eyes: Conjunctivae are normal.  Neck: Normal range of motion. Neck supple.  Pulmonary/Chest: Effort normal.  Abdominal: Soft. There is no tenderness. There is no CVA tenderness.  Musculoskeletal: Normal range of motion.  No step-off noted with palpation of spine.   Neurological: She is alert. She has  normal strength and normal reflexes. No sensory deficit.  5/5 strength in entire lower extremities bilaterally. No sensation deficit.   Skin: Skin is warm and dry. No rash noted.  Psychiatric: She has a normal mood and affect.    ED Course  Procedures (including critical care time) Labs Reviewed  PREGNANCY, URINE  URINALYSIS, ROUTINE W REFLEX MICROSCOPIC   No results found. 1. Back pain     7:41 PM Patient seen and examined. Pt informed of urine test results.   Vital signs reviewed and are as follows: Filed Vitals:   12/13/12 1854  BP:   Pulse:   Temp: 97.7 F (36.5 C)  Resp:   BP 133/77  Pulse 93  Temp(Src) 97.7 F (36.5 C) (Oral)  Resp 18  Ht 4\' 11"  (1.499 m)  Wt 220 lb (99.791 kg)  BMI 44.41 kg/m2  SpO2 100%  LMP 11/29/2012   No red flag s/s of low back pain. Patient was counseled on back pain precautions and told to do activity as tolerated but do not lift, push, or pull heavy objects more than 10 pounds for the next week.  Patient counseled to use ice or heat on back for no longer than 15 minutes every hour.   Patient prescribed muscle relaxer and counseled on proper use of muscle relaxant medication.    Patient prescribed narcotic pain medicine and counseled on proper use of narcotic pain medications. Counseled not to combine this medication with others containing tylenol.   Urged patient not to drink alcohol, drive, or perform any other activities that requires focus while taking either of these medications.  Patient urged to follow-up with PCP if pain does not improve with treatment and rest or if pain becomes recurrent. Urged to return with worsening severe pain, loss of bowel or bladder control, trouble walking.   The patient verbalizes understanding and agrees with the plan.  The patient was urged to return to the Emergency Department immediately with worsening of current symptoms, worsening abdominal pain, persistent vomiting, blood noted in stools,  fever, or any other concerns. The patient verbalized understanding.     MDM  Patient with back pain. No neurological deficits. Patient is ambulatory. No warning symptoms of back pain including: loss of bowel or bladder control, night sweats, waking from sleep with back pain, unexplained fevers or weight loss, h/o cancer, IVDU, recent trauma. No concern for cauda equina, epidural abscess, or other serious cause of back pain. Conservative measures such as rest, ice/heat and pain medicine indicated with PCP follow-up if no improvement with conservative management.  Abdomen is soft. I do not suspect intra-abdominal or intrapelvic etiology of pain at this time.  Pain is exacerbated by movement and resolves with rest and lying flat. She has not had associated symptoms including fevers, vomiting, stool changes. I suspect patient has some muscular tenderness in her abdomen from the fall.    Renne Crigler, PA-C 12/13/12 1946

## 2013-08-29 ENCOUNTER — Emergency Department (HOSPITAL_BASED_OUTPATIENT_CLINIC_OR_DEPARTMENT_OTHER)
Admission: EM | Admit: 2013-08-29 | Discharge: 2013-08-29 | Disposition: A | Discharge status: Home / Self Care | Payer: Medicaid Other | Attending: Emergency Medicine | Admitting: Emergency Medicine

## 2013-08-29 DIAGNOSIS — J45909 Unspecified asthma, uncomplicated: Secondary | ICD-10-CM | POA: Insufficient documentation

## 2013-08-29 DIAGNOSIS — Z8659 Personal history of other mental and behavioral disorders: Secondary | ICD-10-CM | POA: Insufficient documentation

## 2013-08-29 DIAGNOSIS — Z9851 Tubal ligation status: Secondary | ICD-10-CM | POA: Insufficient documentation

## 2013-08-29 DIAGNOSIS — Z3202 Encounter for pregnancy test, result negative: Secondary | ICD-10-CM | POA: Insufficient documentation

## 2013-08-29 DIAGNOSIS — N390 Urinary tract infection, site not specified: Secondary | ICD-10-CM | POA: Insufficient documentation

## 2013-08-29 DIAGNOSIS — F17200 Nicotine dependence, unspecified, uncomplicated: Secondary | ICD-10-CM | POA: Insufficient documentation

## 2013-08-29 LAB — URINALYSIS, ROUTINE W REFLEX MICROSCOPIC
Bilirubin Urine: NEGATIVE
GLUCOSE, UA: NEGATIVE mg/dL
Ketones, ur: NEGATIVE mg/dL
Nitrite: NEGATIVE
PH: 5.5 (ref 5.0–8.0)
PROTEIN: 30 mg/dL — AB
Specific Gravity, Urine: 1.024 (ref 1.005–1.030)
Urobilinogen, UA: 0.2 mg/dL (ref 0.0–1.0)

## 2013-08-29 LAB — URINE MICROSCOPIC-ADD ON

## 2013-08-29 LAB — PREGNANCY, URINE: PREG TEST UR: NEGATIVE

## 2013-08-29 MED ORDER — PHENAZOPYRIDINE HCL 100 MG PO TABS
200.0000 mg | ORAL_TABLET | Freq: Once | ORAL | Status: AC
  Administered 2013-08-29: 200 mg via ORAL
  Filled 2013-08-29: qty 2

## 2013-08-29 MED ORDER — PHENAZOPYRIDINE HCL 200 MG PO TABS: 200.0000 mg | ORAL_TABLET | Freq: Three times a day (TID) | ORAL | Status: DC

## 2013-08-29 MED ORDER — NITROFURANTOIN MONOHYD MACRO 100 MG PO CAPS
100.0000 mg | ORAL_CAPSULE | Freq: Once | ORAL | Status: AC
  Administered 2013-08-29: 100 mg via ORAL
  Filled 2013-08-29: qty 1

## 2013-08-29 MED ORDER — NITROFURANTOIN MONOHYD MACRO 100 MG PO CAPS: 100.0000 mg | ORAL_CAPSULE | Freq: Two times a day (BID) | ORAL | Status: DC

## 2013-08-29 NOTE — ED Provider Notes (Addendum)
CSN: [id number]     Arrival date & time 08/29/13  0706 History   First MD Initiated Contact with Patient 08/29/13 [phone]     Chief Complaint  Patient presents with  . Dysuria     (Consider location/radiation/quality/duration/timing/severity/associated sxs/prior Treatment) Patient is a 38 y.o. female presenting with dysuria. The history is provided by the patient.  Dysuria Pain quality:  Burning, sharp and shooting Pain severity:  Severe Onset quality:  Gradual Duration:  4 days Timing:  Intermittent Progression:  Worsening Chronicity:  New Recent urinary tract infections: no   Relieved by:  None tried Exacerbated by: urinating. Ineffective treatments:  None tried Urinary symptoms: discolored urine, frequent urination and hesitancy   Urinary symptoms: no foul-smelling urine   Associated symptoms: abdominal pain   Associated symptoms: no fever, no flank pain, no nausea, no vaginal discharge and no vomiting   Risk factors: no hx of pyelonephritis, no hx of urolithiasis, not pregnant, no recurrent urinary tract infections and no sexually transmitted infections     Past Medical History  Diagnosis Date  . Asthma   . Depression    Past Surgical History  Procedure Laterality Date  . Cesarean section    . Tubal ligation     No family history on file. History  Substance Use Topics  . Smoking status: Current Every Day Smoker -- 1.00 packs/day    Types: Cigarettes  . Smokeless tobacco: Never Used  . Alcohol Use: No   OB History   Grav Para Term Preterm Abortions TAB SAB Ect Mult Living                 Review of Systems  Constitutional: Negative for fever.  Gastrointestinal: Positive for abdominal pain. Negative for nausea and vomiting.  Genitourinary: Positive for dysuria. Negative for flank pain and vaginal discharge.  All other systems reviewed and are negative.      Allergies  Asa buff (mag; Peanut butter flavor; and Codeine  Home Medications  No current  outpatient prescriptions on file. BP 135/74  Pulse 92  Temp(Src) 98 F (36.7 C) (Oral)  Resp 20  SpO2 99% Physical Exam  Nursing note and vitals reviewed. Constitutional: She is oriented to person, place, and time. She appears well-developed and well-nourished. No distress.  HENT:  Head: Normocephalic and atraumatic.  Mouth/Throat: Oropharynx is clear and moist.  Eyes: Conjunctivae are normal.  Neck: Normal range of motion.  Cardiovascular: Normal rate, regular rhythm and intact distal pulses.   No murmur heard. Pulmonary/Chest: Effort normal and breath sounds normal. No respiratory distress. She has no wheezes. She has no rales.  Abdominal: Soft. Normal appearance. She exhibits no distension. There is tenderness in the suprapubic area. There is no rebound, no guarding and no CVA tenderness.  Musculoskeletal: Normal range of motion. She exhibits no edema and no tenderness.  Neurological: She is alert and oriented to person, place, and time.  Skin: Skin is warm and dry. No rash noted. No erythema.  Psychiatric: She has a normal mood and affect. Her behavior is normal.    ED Course  Procedures (including critical care time) Labs Review Labs Reviewed  URINALYSIS, ROUTINE W REFLEX MICROSCOPIC - Abnormal; Notable for the following:    APPearance CLOUDY (*)    Hgb urine dipstick TRACE (*)    Protein, ur 30 (*)    Leukocytes, UA MODERATE (*)    All other components within normal limits  URINE MICROSCOPIC-ADD ON - Abnormal; Notable for the following:  Squamous Epithelial / LPF FEW (*)    Bacteria, UA MANY (*)    All other components within normal limits  PREGNANCY, URINE   Imaging Review No results found.   EKG Interpretation None      MDM   Final diagnoses:  UTI (lower urinary tract infection)    Patient with symptoms suggestive of a urinary tract infection without complicating features concerning for pyelonephritis, vaginal pathology or kidney stone. Patient is well  appearing with normal vital signs. No history of chronic UTIs. UA and UPT pending.  7:45 AM Ua consistent with UTI and UPT neg.  Will treat with macrobid and pyridium  Gwyneth Sprout, MD 08/29/13 9604  Gwyneth Sprout, MD 08/29/13 [phone]

## 2013-08-29 NOTE — ED Notes (Signed)
Patient here with 2 days of dysuria and urinary frequency. Has been increasing her fluid intake with no relief, denies any other associated symptoms

## 2013-11-20 ENCOUNTER — Emergency Department (HOSPITAL_BASED_OUTPATIENT_CLINIC_OR_DEPARTMENT_OTHER)
Admission: EM | Admit: 2013-11-20 | Discharge: 2013-11-20 | Disposition: A | Discharge status: Home / Self Care | Payer: Medicaid Other | Attending: Emergency Medicine | Admitting: Emergency Medicine

## 2013-11-20 ENCOUNTER — Encounter (HOSPITAL_BASED_OUTPATIENT_CLINIC_OR_DEPARTMENT_OTHER): Payer: Self-pay | Admitting: Emergency Medicine

## 2013-11-20 DIAGNOSIS — Z79899 Other long term (current) drug therapy: Secondary | ICD-10-CM | POA: Insufficient documentation

## 2013-11-20 DIAGNOSIS — J45909 Unspecified asthma, uncomplicated: Secondary | ICD-10-CM | POA: Insufficient documentation

## 2013-11-20 DIAGNOSIS — N949 Unspecified condition associated with female genital organs and menstrual cycle: Secondary | ICD-10-CM | POA: Insufficient documentation

## 2013-11-20 DIAGNOSIS — Z792 Long term (current) use of antibiotics: Secondary | ICD-10-CM | POA: Insufficient documentation

## 2013-11-20 DIAGNOSIS — N938 Other specified abnormal uterine and vaginal bleeding: Secondary | ICD-10-CM | POA: Insufficient documentation

## 2013-11-20 DIAGNOSIS — N925 Other specified irregular menstruation: Secondary | ICD-10-CM | POA: Insufficient documentation

## 2013-11-20 DIAGNOSIS — Z8659 Personal history of other mental and behavioral disorders: Secondary | ICD-10-CM | POA: Insufficient documentation

## 2013-11-20 DIAGNOSIS — F17200 Nicotine dependence, unspecified, uncomplicated: Secondary | ICD-10-CM | POA: Insufficient documentation

## 2013-11-20 DIAGNOSIS — R42 Dizziness and giddiness: Secondary | ICD-10-CM | POA: Insufficient documentation

## 2013-11-20 LAB — BASIC METABOLIC PANEL
BUN: 12 mg/dL (ref 6–23)
CALCIUM: 9 mg/dL (ref 8.4–10.5)
CO2: 22 mEq/L (ref 19–32)
Chloride: 105 mEq/L (ref 96–112)
Creatinine, Ser: 0.8 mg/dL (ref 0.50–1.10)
GLUCOSE: 93 mg/dL (ref 70–99)
POTASSIUM: 4.5 meq/L (ref 3.7–5.3)
SODIUM: 138 meq/L (ref 137–147)

## 2013-11-20 LAB — URINE MICROSCOPIC-ADD ON

## 2013-11-20 LAB — CBC
HCT: 30.7 % — ABNORMAL LOW (ref 36.0–46.0)
Hemoglobin: 9.1 g/dL — ABNORMAL LOW (ref 12.0–15.0)
MCH: 22.6 pg — AB (ref 26.0–34.0)
MCHC: 29.6 g/dL — AB (ref 30.0–36.0)
MCV: 76.2 fL — ABNORMAL LOW (ref 78.0–100.0)
PLATELETS: 272 10*3/uL (ref 150–400)
RBC: 4.03 MIL/uL (ref 3.87–5.11)
RDW: 16.8 % — AB (ref 11.5–15.5)
WBC: 7.6 10*3/uL (ref 4.0–10.5)

## 2013-11-20 LAB — URINALYSIS, ROUTINE W REFLEX MICROSCOPIC
BILIRUBIN URINE: NEGATIVE
Glucose, UA: NEGATIVE mg/dL
Ketones, ur: NEGATIVE mg/dL
LEUKOCYTES UA: NEGATIVE
NITRITE: NEGATIVE
Protein, ur: NEGATIVE mg/dL
SPECIFIC GRAVITY, URINE: 1.024 (ref 1.005–1.030)
UROBILINOGEN UA: 0.2 mg/dL (ref 0.0–1.0)
pH: 5 (ref 5.0–8.0)

## 2013-11-20 LAB — RPR

## 2013-11-20 LAB — WET PREP, GENITAL
Trich, Wet Prep: NONE SEEN
Yeast Wet Prep HPF POC: NONE SEEN

## 2013-11-20 LAB — PROTIME-INR
INR: 1.01 (ref 0.00–1.49)
PROTHROMBIN TIME: 13.3 s (ref 11.6–15.2)

## 2013-11-20 LAB — HIV ANTIBODY (ROUTINE TESTING W REFLEX): HIV: NONREACTIVE

## 2013-11-20 MED ORDER — MEDROXYPROGESTERONE ACETATE 10 MG PO TABS: 5.0000 mg | ORAL_TABLET | Freq: Every day | ORAL | Status: DC

## 2013-11-20 MED ORDER — HYDROCODONE-ACETAMINOPHEN 5-325 MG PO TABS: 2.0000 | ORAL_TABLET | ORAL | Status: DC | PRN

## 2013-11-20 NOTE — ED Notes (Signed)
Pelvic cart set up at pt bedside. 

## 2013-11-20 NOTE — ED Notes (Signed)
Has had vag. Bleeding since 6-13 and was intermittant until 6-18. Now bleeding is heavy. Changes pad every 2 hours. C/o pain in low abd and above epigastric area. C/o lightheadedness.

## 2013-11-20 NOTE — ED Provider Notes (Signed)
CSN: [id number]     Arrival date & time 11/20/13  [id number] History   First MD Initiated Contact with Patient 11/20/13 1035     Chief Complaint  Patient presents with  . Vaginal Bleeding     (Consider location/radiation/quality/duration/timing/severity/associated sxs/prior Treatment) HPI Comments: Patient presents to the ER for evaluation of vaginal bleeding. Patient reports that she had onset of vaginal bleeding on June 13. She thought was her normal menstrual cycle, but she has been bleeding heavier than usual. Initially she was passing clots. Symptoms have slowed down, but she is still drinking bleeding which is longer than she normally has. She is experiencing pelvic cramping with the symptoms. She reports that she had been changing pads every 2 hours, but it has lightened up this morning. She feels weak and at times lightheaded.  Patient is a 38 y.o. female presenting with vaginal bleeding.  Vaginal Bleeding   Past Medical History  Diagnosis Date  . Asthma   . Depression    Past Surgical History  Procedure Laterality Date  . Cesarean section    . Tubal ligation     No family history on file. History  Substance Use Topics  . Smoking status: Current Every Day Smoker -- 1.00 packs/day    Types: Cigarettes  . Smokeless tobacco: Never Used  . Alcohol Use: No   OB History   Grav Para Term Preterm Abortions TAB SAB Ect Mult Living                 Review of Systems  Genitourinary: Positive for vaginal bleeding and pelvic pain.  Neurological: Positive for light-headedness.  All other systems reviewed and are negative.     Allergies  Asa buff (mag; Peanut butter flavor; and Codeine  Home Medications   Prior to Admission medications   Medication Sig Start Date End Date Taking? Authorizing Provider  HYDROcodone-acetaminophen (NORCO/VICODIN) 5-325 MG per tablet Take 2 tablets by mouth every 4 (four) hours as needed for moderate pain. 11/20/13   Gilda Creasehristopher J. Pollina, MD   medroxyPROGESTERone (PROVERA) 10 MG tablet Take 0.5 tablets (5 mg total) by mouth daily. 11/20/13   Gilda Creasehristopher J. Pollina, MD  nitrofurantoin, macrocrystal-monohydrate, (MACROBID) 100 MG capsule Take 1 capsule (100 mg total) by mouth 2 (two) times daily. 08/29/13   Gwyneth SproutWhitney Plunkett, MD  phenazopyridine (PYRIDIUM) 200 MG tablet Take 1 tablet (200 mg total) by mouth 3 (three) times daily. 08/29/13   Gwyneth SproutWhitney Plunkett, MD   BP 139/69  Pulse 96  Temp(Src) 99.5 F (37.5 C) (Oral)  Resp 20  Ht 4\' 11"  (1.499 m)  Wt 212 lb (96.163 kg)  BMI 42.80 kg/m2  SpO2 100%  LMP 11/07/2013 Physical Exam  Constitutional: She is oriented to person, place, and time. She appears well-developed and well-nourished. No distress.  HENT:  Head: Normocephalic and atraumatic.  Right Ear: Hearing normal.  Left Ear: Hearing normal.  Nose: Nose normal.  Mouth/Throat: Oropharynx is clear and moist and mucous membranes are normal.  Eyes: Conjunctivae and EOM are normal. Pupils are equal, round, and reactive to light.  Neck: Normal range of motion. Neck supple.  Cardiovascular: Regular rhythm, S1 normal and S2 normal.  Exam reveals no gallop and no friction rub.   No murmur heard. Pulmonary/Chest: Effort normal and breath sounds normal. No respiratory distress. She exhibits no tenderness.  Abdominal: Soft. Normal appearance and bowel sounds are normal. There is no hepatosplenomegaly. There is no tenderness. There is no rebound, no guarding, no tenderness at McBurney's  point and negative Murphy's sign. No hernia.  Musculoskeletal: Normal range of motion.  Neurological: She is alert and oriented to person, place, and time. She has normal strength. No cranial nerve deficit or sensory deficit. Coordination normal. GCS eye subscore is 4. GCS verbal subscore is 5. GCS motor subscore is 6.  Skin: Skin is warm, dry and intact. No rash noted. No cyanosis.  Psychiatric: She has a normal mood and affect. Her speech is normal and  behavior is normal. Thought content normal.    ED Course  Procedures (including critical care time) Labs Review Labs Reviewed  WET PREP, GENITAL - Abnormal; Notable for the following:    Clue Cells Wet Prep HPF POC FEW (*)    WBC, Wet Prep HPF POC MODERATE (*)    All other components within normal limits  CBC - Abnormal; Notable for the following:    Hemoglobin 9.1 (*)    HCT 30.7 (*)    MCV 76.2 (*)    MCH 22.6 (*)    MCHC 29.6 (*)    RDW 16.8 (*)    All other components within normal limits  URINALYSIS, ROUTINE W REFLEX MICROSCOPIC - Abnormal; Notable for the following:    Hgb urine dipstick MODERATE (*)    All other components within normal limits  GC/CHLAMYDIA PROBE AMP  BASIC METABOLIC PANEL  PROTIME-INR  URINE MICROSCOPIC-ADD ON  RPR  HIV ANTIBODY (ROUTINE TESTING)    Imaging Review No results found.   EKG Interpretation None      MDM   Final diagnoses:  DUB (dysfunctional uterine bleeding)   Patient presents to the ER for evaluation of vaginal bleeding. Patient reports that she normally has very regular with her bleeding, but has had excessive bleeding, passage of clots, bleeding for longer than she normally has with her menstrual cycle this month. She is experiencing pelvic cramping. She has felt dizzy. The reveals mild anemia compared to her baseline. She does not require any intervention for this. Pelvic exam is unremarkable. Remainder of the blood work was unremarkable. Patient will require gynecologic evaluation. She'll be placed on Provera to help the bleeding, and Vicodin for pain.    Gilda Creasehristopher J. Pollina, MD 11/20/13 1158

## 2013-11-20 NOTE — Discharge Instructions (Signed)
Abnormal Uterine Bleeding Abnormal uterine bleeding can affect women at various stages in life, including teenagers, women in their reproductive years, pregnant women, and women who have reached menopause. Several kinds of uterine bleeding are considered abnormal, including:  Bleeding or spotting between periods.   Bleeding after sexual intercourse.   Bleeding that is heavier or more than normal.   Periods that last longer than usual.  Bleeding after menopause.  Many cases of abnormal uterine bleeding are minor and simple to treat, while others are more serious. Any type of abnormal bleeding should be evaluated by your health care provider. Treatment will depend on the cause of the bleeding. HOME CARE INSTRUCTIONS Monitor your condition for any changes. The following actions may help to alleviate any discomfort you are experiencing:  Avoid the use of tampons and douches as directed by your health care provider.  Change your pads frequently. You should get regular pelvic exams and Pap tests. Keep all follow-up appointments for diagnostic tests as directed by your health care provider.  SEEK MEDICAL CARE IF:   Your bleeding lasts more than 1 week.   You feel dizzy at times.  SEEK IMMEDIATE MEDICAL CARE IF:   You pass out.   You are changing pads every 15 to 30 minutes.   You have abdominal pain.  You have a fever.   You become sweaty or weak.   You are passing large blood clots from the vagina.   You start to feel nauseous and vomit. MAKE SURE YOU:   Understand these instructions.  Will watch your condition.  Will get help right away if you are not doing well or get worse. Document Released: 05/14/2005 Document Revised: 05/19/2013 Document Reviewed: 12/11/2012 [employer] Patient Information 2015 [employer]. This information is not intended to replace advice given to you by your health care provider. Make sure you discuss any questions you have with your  health care provider.  

## 2013-11-21 LAB — GC/CHLAMYDIA PROBE AMP
CT Probe RNA: NEGATIVE
GC Probe RNA: NEGATIVE

## 2014-02-18 ENCOUNTER — Emergency Department (HOSPITAL_BASED_OUTPATIENT_CLINIC_OR_DEPARTMENT_OTHER)
Admission: EM | Admit: 2014-02-18 | Discharge: 2014-02-18 | Disposition: A | Discharge status: Home / Self Care | Payer: Medicaid Other | Attending: Emergency Medicine | Admitting: Emergency Medicine

## 2014-02-18 ENCOUNTER — Encounter (HOSPITAL_BASED_OUTPATIENT_CLINIC_OR_DEPARTMENT_OTHER): Payer: Self-pay | Admitting: Emergency Medicine

## 2014-02-18 DIAGNOSIS — J069 Acute upper respiratory infection, unspecified: Secondary | ICD-10-CM

## 2014-02-18 DIAGNOSIS — F3289 Other specified depressive episodes: Secondary | ICD-10-CM | POA: Insufficient documentation

## 2014-02-18 DIAGNOSIS — J45901 Unspecified asthma with (acute) exacerbation: Secondary | ICD-10-CM | POA: Diagnosis not present

## 2014-02-18 DIAGNOSIS — Z79899 Other long term (current) drug therapy: Secondary | ICD-10-CM | POA: Insufficient documentation

## 2014-02-18 DIAGNOSIS — F329 Major depressive disorder, single episode, unspecified: Secondary | ICD-10-CM | POA: Diagnosis not present

## 2014-02-18 DIAGNOSIS — R062 Wheezing: Secondary | ICD-10-CM | POA: Insufficient documentation

## 2014-02-18 DIAGNOSIS — F17200 Nicotine dependence, unspecified, uncomplicated: Secondary | ICD-10-CM | POA: Diagnosis not present

## 2014-02-18 MED ORDER — ALBUTEROL SULFATE (2.5 MG/3ML) 0.083% IN NEBU
5.0000 mg | INHALATION_SOLUTION | Freq: Once | RESPIRATORY_TRACT | Status: AC
  Administered 2014-02-18: 5 mg via RESPIRATORY_TRACT
  Filled 2014-02-18: qty 6

## 2014-02-18 NOTE — ED Provider Notes (Signed)
I saw and evaluated the patient, reviewed the resident's note and I agree with the findings and plan.   .Face to face Exam:  General:  Awake HEENT:  Atraumatic Resp:  Scattered mild wheezing Abd:  Nondistended Neuro:No focal weakness  Nelia Shi, MD 02/18/14 [phone]

## 2014-02-18 NOTE — Discharge Instructions (Signed)
Take  of prednisone once a day for 4 days. Also start taking the Z-pack as directed. Return to the ED or call your PCP if you symptoms do worsen or do not improve within 1 week.    Cough, Adult  A cough is a reflex. It helps you clear your throat and airways. A cough can help heal your body. A cough can last 2 or 3 weeks (acute) or may last more than 8 weeks (chronic). Some common causes of a cough can include an infection, allergy, or a cold. HOME CARE  Only take medicine as told by your doctor.  If given, take your medicines (antibiotics) as told. Finish them even if you start to feel better.  Use a cold steam vaporizer or humidifier in your home. This can help loosen thick spit (secretions).  Sleep so you are almost sitting up (semi-upright). Use pillows to do this. This helps reduce coughing.  Rest as needed.  Stop smoking if you smoke. GET HELP RIGHT AWAY IF:  You have yellowish-white fluid (pus) in your thick spit.  Your cough gets worse.  Your medicine does not reduce coughing, and you are losing sleep.  You cough up blood.  You have trouble breathing.  Your pain gets worse and medicine does not help.  You have a fever. MAKE SURE YOU:   Understand these instructions.  Will watch your condition.  Will get help right away if you are not doing well or get worse. Document Released: 01/25/2011 Document Revised: 09/28/2013 Document Reviewed: 01/25/2011 [facility] Patient Information 2015 [place], [place]. This information is not intended to replace advice given to you by your health care provider. Make sure you discuss any questions you have with your health care provider.

## 2014-02-18 NOTE — ED Provider Notes (Signed)
CSN: [id number]     Arrival date & time 02/18/14  4034 History   First MD Initiated Contact with Patient 02/18/14 0725     Chief Complaint  Patient presents with  . Wheezing     (Consider location/radiation/quality/duration/timing/severity/associated sxs/prior Treatment) Patient is a 38 y.o. female presenting with wheezing.  Wheezing Associated symptoms: cough and fever    Pt is a 38 y/o female w/ PMHx of DM who presents to the ED w/ cough and difficulty breathing x 5 days. She started having a cold Saturday which has since started to resolve. However, she is having coughing with productive sputum after she uses her inhaler. Sputum produced is brown. She states she is having difficulty breathing due to congestion and has not been able to sleep well. She normally uses her inhaler once a day but has been having to use her inhaler 3 times a day without relief. She smokes 1PPD but has not been able to since Saturday. She had a measure temp of 100.3 def F at home. Denies N/V, NS, chills, diarrhea, and sore throat.   Past Medical History  Diagnosis Date  . Asthma   . Depression    Past Surgical History  Procedure Laterality Date  . Cesarean section    . Tubal ligation    . Cholecystectomy     No family history on file. History  Substance Use Topics  . Smoking status: Current Every Day Smoker -- 1.00 packs/day    Types: Cigarettes  . Smokeless tobacco: Never Used  . Alcohol Use: No   OB History   Grav Para Term Preterm Abortions TAB SAB Ect Mult Living                 Review of Systems  Constitutional: Positive for fever. Negative for chills.  HENT: Positive for congestion.   Respiratory: Positive for cough and wheezing.   Gastrointestinal: Negative for nausea, vomiting and diarrhea.      Allergies  Asa buff (mag; Peanut butter flavor; and Codeine  Home Medications   Prior to Admission medications   Medication Sig Start Date End Date Taking? Authorizing Provider   albuterol (ACCUNEB) 1.25 MG/3ML nebulizer solution Take 1 ampule by nebulization every 6 (six) hours as needed for wheezing.   Yes Historical Provider, MD  HYDROcodone-acetaminophen (NORCO/VICODIN) 5-325 MG per tablet Take 2 tablets by mouth every 4 (four) hours as needed for moderate pain. 11/20/13   Gilda Crease, MD  medroxyPROGESTERone (PROVERA) 10 MG tablet Take 0.5 tablets (5 mg total) by mouth daily. 11/20/13   Gilda Crease, MD  nitrofurantoin, macrocrystal-monohydrate, (MACROBID) 100 MG capsule Take 1 capsule (100 mg total) by mouth 2 (two) times daily. 08/29/13   Gwyneth Sprout, MD  phenazopyridine (PYRIDIUM) 200 MG tablet Take 1 tablet (200 mg total) by mouth 3 (three) times daily. 08/29/13   Gwyneth Sprout, MD   BP 127/81  Pulse 77  Temp(Src) 98.2 F (36.8 C) (Oral)  Resp 20  Ht  (1.499 m)  Wt 208 lb (94.348 kg)  BMI 41.99 kg/m2  SpO2 99%  LMP 01/24/2014 Physical Exam  Constitutional: She appears well-developed and well-nourished.  Able to speak in full sentences   HENT:  Post nasal drip   Neck: Normal range of motion. Neck supple.  Cardiovascular: Normal rate and regular rhythm.   Pulmonary/Chest: She has wheezes.  Rhonchi   Abdominal: Soft. Bowel sounds are normal. There is no tenderness.  Lymphadenopathy:    She has cervical adenopathy.  ED Course  Procedures (including critical care time) Labs Review   MDM   Final diagnoses:  URI (upper respiratory infection)    Pt presents to the ED w/ coughing and difficulty breathing likely reactive airway disease. Pt received nebulizing treatment in the ED. Will give zpack and 4 days of prednisone  daily.      Gara Kroner, MD 02/18/14 [phone]

## 2014-02-18 NOTE — ED Notes (Signed)
Pt sts she has been wheezing and coughing x1 week. Sts she has been giving herself breathing tx's with little relief. Coughing up dark phlegm after breathing tx's.

## 2014-05-30 ENCOUNTER — Encounter (HOSPITAL_BASED_OUTPATIENT_CLINIC_OR_DEPARTMENT_OTHER): Payer: Self-pay | Admitting: *Deleted

## 2014-05-30 ENCOUNTER — Emergency Department (HOSPITAL_BASED_OUTPATIENT_CLINIC_OR_DEPARTMENT_OTHER): Payer: Medicaid Other

## 2014-05-30 ENCOUNTER — Emergency Department (HOSPITAL_BASED_OUTPATIENT_CLINIC_OR_DEPARTMENT_OTHER)
Admission: EM | Admit: 2014-05-30 | Discharge: 2014-05-30 | Disposition: A | Discharge status: Home / Self Care | Payer: Medicaid Other | Attending: Emergency Medicine | Admitting: Emergency Medicine

## 2014-05-30 DIAGNOSIS — Z792 Long term (current) use of antibiotics: Secondary | ICD-10-CM | POA: Insufficient documentation

## 2014-05-30 DIAGNOSIS — Z79899 Other long term (current) drug therapy: Secondary | ICD-10-CM | POA: Insufficient documentation

## 2014-05-30 DIAGNOSIS — J45909 Unspecified asthma, uncomplicated: Secondary | ICD-10-CM | POA: Insufficient documentation

## 2014-05-30 DIAGNOSIS — M25562 Pain in left knee: Secondary | ICD-10-CM | POA: Diagnosis not present

## 2014-05-30 DIAGNOSIS — Z720 Tobacco use: Secondary | ICD-10-CM | POA: Insufficient documentation

## 2014-05-30 DIAGNOSIS — R52 Pain, unspecified: Secondary | ICD-10-CM

## 2014-05-30 DIAGNOSIS — Z8659 Personal history of other mental and behavioral disorders: Secondary | ICD-10-CM | POA: Insufficient documentation

## 2014-05-30 MED ORDER — HYDROCODONE-ACETAMINOPHEN 5-325 MG PO TABS
2.0000 | ORAL_TABLET | Freq: Once | ORAL | Status: AC
  Administered 2014-05-30: 2 via ORAL
  Filled 2014-05-30: qty 2

## 2014-05-30 MED ORDER — NAPROXEN 500 MG PO TABS: 500.0000 mg | ORAL_TABLET | Freq: Two times a day (BID) | ORAL | Status: DC

## 2014-05-30 MED ORDER — HYDROCODONE-ACETAMINOPHEN 5-325 MG PO TABS: 1.0000 | ORAL_TABLET | ORAL | Status: DC | PRN

## 2014-05-30 NOTE — ED Provider Notes (Signed)
CSN: [id number]     Arrival date & time 05/30/14  1640 History   First MD Initiated Contact with Patient 05/30/14 1847     Chief Complaint  Patient presents with  . Knee Pain   Karla Carey is a 39 y.o. female he presents to emergency department complaining of atraumatic left knee pain for the past month. The patient reports her pain is 8 out of 10 and worse with movement. Patient denies any trauma or previous injury to her knee. Patient denies surgeries. Knee. Patient denies red or swollen joints. Patient reports pain is worse going downstairs and better with sitting or rest. Patient which is at all without relief. The patient denies fevers, chills, redness, rashes, numbness or tingling or previous surgeries.  (Consider location/radiation/quality/duration/timing/severity/associated sxs/prior Treatment) HPI  Past Medical History  Diagnosis Date  . Asthma   . Depression    Past Surgical History  Procedure Laterality Date  . Cesarean section    . Tubal ligation    . Cholecystectomy     No family history on file. History  Substance Use Topics  . Smoking status: Current Every Day Smoker -- 1.00 packs/day    Types: Cigarettes  . Smokeless tobacco: Never Used  . Alcohol Use: No   OB History    No data available     Review of Systems  Constitutional: Negative for fever and chills.  HENT: Negative for ear pain.   Respiratory: Negative for cough, shortness of breath and wheezing.   Cardiovascular: Negative for chest pain and palpitations.  Gastrointestinal: Negative for nausea, vomiting, abdominal pain and diarrhea.  Genitourinary: Negative for difficulty urinating.  Musculoskeletal:       Left knee pain  Skin: Negative for rash and wound.  Neurological: Negative for weakness, light-headedness and numbness.      Allergies  Asa buff (mag; Peanut butter flavor; and Codeine  Home Medications   Prior to Admission medications   Medication Sig Start Date End Date Taking?  Authorizing Provider  albuterol (ACCUNEB) 1.25 MG/3ML nebulizer solution Take 1 ampule by nebulization every 6 (six) hours as needed for wheezing.    Historical Provider, MD  HYDROcodone-acetaminophen (NORCO/VICODIN) 5-325 MG per tablet Take 1 tablet by mouth every 4 (four) hours as needed for moderate pain or severe pain. 05/30/14   Einar Gip Alyscia Carmon, PA-C  medroxyPROGESTERone (PROVERA) 10 MG tablet Take 0.5 tablets (5 mg total) by mouth daily. 11/20/13   Gilda Crease, MD  naproxen (NAPROSYN) 500 MG tablet Take 1 tablet (500 mg total) by mouth 2 (two) times daily with a meal. 05/30/14   Einar Gip Analyn Matusek, PA-C  nitrofurantoin, macrocrystal-monohydrate, (MACROBID) 100 MG capsule Take 1 capsule (100 mg total) by mouth 2 (two) times daily. 08/29/13   Gwyneth Sprout, MD  phenazopyridine (PYRIDIUM) 200 MG tablet Take 1 tablet (200 mg total) by mouth 3 (three) times daily. 08/29/13   Gwyneth Sprout, MD   BP 123/72 mmHg  Pulse 89  Temp(Src) 98.4 F (36.9 C) (Oral)  Resp 18  Ht  (1.499 m)  Wt 215 lb (97.523 kg)  BMI 43.40 kg/m2  SpO2 100%  LMP 05/02/2014 Physical Exam  Constitutional: She appears well-developed and well-nourished. No distress.  HENT:  Head: Normocephalic and atraumatic.  Mouth/Throat: Oropharynx is clear and moist.  Eyes: Conjunctivae are normal. Pupils are equal, round, and reactive to light. Right eye exhibits no discharge. Left eye exhibits no discharge.  Neck: Neck supple.  Cardiovascular: Normal rate, regular rhythm, normal heart  sounds and intact distal pulses.  Exam reveals no gallop and no friction rub.   No murmur heard. Pulmonary/Chest: Effort normal and breath sounds normal. No respiratory distress. She has no wheezes. She has no rales.  Abdominal: Soft. There is no tenderness.  Musculoskeletal: Normal range of motion. She exhibits tenderness. She exhibits no edema.  Patient's left  Knee is generally tender to palpation. Patient has pain with  range of motion. Patient has full range of motion of her left knee. No knee edema noted. No deformity noted. No ligament laxity noted. No joint erythema or edema noted.  Lymphadenopathy:    She has no cervical adenopathy.  Neurological: She is alert. Coordination normal.  Skin: Skin is warm and dry. No rash noted. She is not diaphoretic. No erythema. No pallor.  Psychiatric: She has a normal mood and affect. Her behavior is normal.  Nursing note and vitals reviewed.   ED Course  Procedures (including critical care time) Labs Review Labs Reviewed - No data to display  Imaging Review Dg Knee 1-2 Views Left  05/30/2014   CLINICAL DATA:  Anteromedial knee pain on the left for 1 month, increasing.  EXAM: LEFT KNEE - 1-2 VIEW  COMPARISON:  None.  FINDINGS: Mild spurring of the tibial spine. Mild articular spurring of the patella. No knee effusion, fracture, or acute bony findings.  IMPRESSION: 1. Mild patellar spurring.  No acute findings.   Electronically Signed   By: Herbie Baltimore M.D.   On: 05/30/2014 18:29     EKG Interpretation None      Filed Vitals:   05/30/14 1653 05/30/14 1947 05/30/14 1948  BP: 123/72 122/65   Pulse: 89  88  Temp: 98.4 F (36.9 C)    TempSrc: Oral    Resp: 18    Height:  (1.499 m)    Weight: 215 lb (97.523 kg)    SpO2: 100%  100%     MDM   Meds given in ED:  Medications  HYDROcodone-acetaminophen (NORCO/VICODIN) 5-325 MG per tablet 2 tablet (2 tablets Oral Given 05/30/14 1945)    Discharge Medication List as of 05/30/2014  7:44 PM    START taking these medications   Details  naproxen (NAPROSYN) 500 MG tablet Take 1 tablet (500 mg total) by mouth 2 (two) times daily with a meal., Starting 05/30/2014, Until Discontinued, Print        Final diagnoses:  Left knee pain   This is a 39 year old female who presented to the emergency room chronic finding of atraumatic left knee pain for the past month. The patient is afebrile and  nontoxic-appearing. There is no knee edema or erythema. The patient has full range of motion of her knee. The patient's knee is generally tender to palpation. No knee laxity noted. The patient's knee x-ray indicated mild patellar spurring. Patient education provided. The patient is provided a knee sleeve. The patient would like information on orthopedic surgeon, which was provided. Patient was provided a prescription for naproxen and Norco for breakthrough pain. Advised patient use caution while taking Norco as it can make her drowsy. Advised patient not to drive while taking Norco. Advised patient to follow-up with her primary care provider for continued pain. Advised patient to return to the emergency department with new or  worsening symptoms or new concerns. Patient verbalized understanding and agreement with plan.     Lawana Chambers, PA-C 05/30/14 2109  Nelia Shi, MD 06/03/14 [phone]

## 2014-05-30 NOTE — ED Notes (Signed)
Patient states that she is having left knee pain that has been "going on for awhile"., but worsening.

## 2014-05-30 NOTE — Discharge Instructions (Signed)

## 2014-10-14 ENCOUNTER — Encounter (HOSPITAL_BASED_OUTPATIENT_CLINIC_OR_DEPARTMENT_OTHER): Payer: Self-pay

## 2014-10-14 ENCOUNTER — Emergency Department (HOSPITAL_BASED_OUTPATIENT_CLINIC_OR_DEPARTMENT_OTHER)
Admission: EM | Admit: 2014-10-14 | Discharge: 2014-10-14 | Disposition: A | Discharge status: Home / Self Care | Payer: Medicaid Other | Attending: Emergency Medicine | Admitting: Emergency Medicine

## 2014-10-14 ENCOUNTER — Emergency Department (HOSPITAL_BASED_OUTPATIENT_CLINIC_OR_DEPARTMENT_OTHER): Payer: Medicaid Other

## 2014-10-14 DIAGNOSIS — Y9389 Activity, other specified: Secondary | ICD-10-CM | POA: Insufficient documentation

## 2014-10-14 DIAGNOSIS — Y9289 Other specified places as the place of occurrence of the external cause: Secondary | ICD-10-CM | POA: Diagnosis not present

## 2014-10-14 DIAGNOSIS — X58XXXA Exposure to other specified factors, initial encounter: Secondary | ICD-10-CM | POA: Diagnosis not present

## 2014-10-14 DIAGNOSIS — Z8659 Personal history of other mental and behavioral disorders: Secondary | ICD-10-CM | POA: Insufficient documentation

## 2014-10-14 DIAGNOSIS — T148XXA Other injury of unspecified body region, initial encounter: Secondary | ICD-10-CM

## 2014-10-14 DIAGNOSIS — Z720 Tobacco use: Secondary | ICD-10-CM | POA: Insufficient documentation

## 2014-10-14 DIAGNOSIS — M25529 Pain in unspecified elbow: Secondary | ICD-10-CM

## 2014-10-14 DIAGNOSIS — J45909 Unspecified asthma, uncomplicated: Secondary | ICD-10-CM | POA: Insufficient documentation

## 2014-10-14 DIAGNOSIS — S59902A Unspecified injury of left elbow, initial encounter: Secondary | ICD-10-CM | POA: Insufficient documentation

## 2014-10-14 DIAGNOSIS — Y998 Other external cause status: Secondary | ICD-10-CM | POA: Diagnosis not present

## 2014-10-14 DIAGNOSIS — M79602 Pain in left arm: Secondary | ICD-10-CM | POA: Diagnosis present

## 2014-10-14 DIAGNOSIS — X503XXA Overexertion from repetitive movements, initial encounter: Secondary | ICD-10-CM

## 2014-10-14 MED ORDER — HYDROCODONE-ACETAMINOPHEN 5-325 MG PO TABS
2.0000 | ORAL_TABLET | Freq: Once | ORAL | Status: DC
  Filled 2014-10-14: qty 2

## 2014-10-14 MED ORDER — ACETAMINOPHEN 500 MG PO TABS: 1000.0000 mg | ORAL_TABLET | Freq: Once | ORAL | Status: DC

## 2014-10-14 MED ORDER — HYDROCODONE-ACETAMINOPHEN 5-325 MG PO TABS: 1.0000 | ORAL_TABLET | Freq: Four times a day (QID) | ORAL | Status: DC | PRN

## 2014-10-14 MED ORDER — ACETAMINOPHEN 325 MG PO TABS
650.0000 mg | ORAL_TABLET | Freq: Once | ORAL | Status: AC
  Administered 2014-10-14: 650 mg via ORAL
  Filled 2014-10-14: qty 2

## 2014-10-14 NOTE — ED Notes (Signed)
Patient preparing for discharge. 

## 2014-10-14 NOTE — ED Notes (Signed)
MD at bedside. 

## 2014-10-14 NOTE — ED Provider Notes (Addendum)
CSN: [id number]     Arrival date & time 10/14/14  0753 History   First MD Initiated Contact with Patient 10/14/14 0756     Chief Complaint  Patient presents with  . Arm Pain     (Consider location/radiation/quality/duration/timing/severity/associated sxs/prior Treatment) HPI Comments: L elbow pain for past 2 weeks, gradually worsening. Began in the elbow, now radiating down into the wrist. No direct trauma. 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 Pain with supination, flexion, use of her hand.  Patient is a 39 y.o. female presenting with arm pain.  Arm Pain This is a new problem. The current episode started more than 1 week ago. The problem occurs constantly. The problem has been gradually worsening. Pertinent negatives include no chest pain, no abdominal pain and no shortness of breath. Nothing aggravates the symptoms. Nothing relieves the symptoms.    Past Medical History  Diagnosis Date  . Asthma   . Depression    Past Surgical History  Procedure Laterality Date  . Cesarean section    . Tubal ligation    . Cholecystectomy     No family history on file. History  Substance Use Topics  . Smoking status: Current Every Day Smoker -- 1.00 packs/day    Types: Cigarettes  . Smokeless tobacco: Never Used  . Alcohol Use: No   OB History    No data available     Review of Systems  Constitutional: Negative for fever.  Respiratory: Negative for cough and shortness of breath.   Cardiovascular: Negative for chest pain.  Gastrointestinal: Negative for nausea, vomiting and abdominal pain.  All other systems reviewed and are negative.     Allergies  Asa buff (mag; Peanut butter flavor; and Codeine  Home Medications   Prior to Admission medications   Medication Sig Start Date End Date Taking? Authorizing Provider  ALBUTEROL SULFATE HFA IN Inhale into the lungs.   Yes Historical Provider, MD  albuterol (ACCUNEB) 1.25 MG/3ML nebulizer solution Take 1 ampule by  nebulization every 6 (six) hours as needed for wheezing.    Historical Provider, MD  HYDROcodone-acetaminophen (NORCO/VICODIN) 5-325 MG per tablet Take 1 tablet by mouth every 6 (six) hours as needed for moderate pain. 10/14/14   Elwin MochaBlair Lakesia Dahle, MD   BP 135/71 mmHg  Pulse 78  Temp(Src) 97.5 F (36.4 C) (Oral)  Resp 16  Ht 4\' 11"  (1.499 m)  Wt 207 lb (93.895 kg)  BMI 41.79 kg/m2  SpO2 100%  LMP 10/14/2014 Physical Exam  Constitutional: She is oriented to person, place, and time. She appears well-developed and well-nourished. No distress.  HENT:  Head: Normocephalic and atraumatic.  Mouth/Throat: Oropharynx is clear and moist.  Eyes: EOM are normal. Pupils are equal, round, and reactive to light.  Neck: Normal range of motion. Neck supple.  Cardiovascular: Normal rate and regular rhythm.  Exam reveals no friction rub.   No murmur heard. Pulmonary/Chest: Effort normal and breath sounds normal. No respiratory distress. She has no wheezes. She has no rales.  Abdominal: Soft. She exhibits no distension. There is no tenderness. There is no rebound.  Musculoskeletal: Normal range of motion. She exhibits no edema.       Left elbow: She exhibits normal range of motion, no swelling, no effusion, no deformity and no laceration. Tenderness (diffuse) found.       Left wrist: She exhibits tenderness (diffuse). She exhibits normal range of motion, no bony tenderness, no swelling, no effusion, no crepitus, no deformity and no  laceration.  Neurological: She is alert and oriented to person, place, and time.  Skin: No rash noted. She is not diaphoretic.  Nursing note and vitals reviewed.   ED Course  Procedures (including critical care time) Labs Review Labs Reviewed - No data to display  Imaging Review Dg Elbow Complete Left  10/14/2014   CLINICAL DATA:  Elbow and wrist pain for 2 weeks.  No injury.  EXAM: LEFT ELBOW - COMPLETE 3+ VIEW  COMPARISON:  None.  FINDINGS: There is no evidence of fracture,  dislocation, or joint effusion. There is no evidence of arthropathy or other focal bone abnormality. Soft tissues are unremarkable.  IMPRESSION: Negative.   Electronically Signed   By: Andreas NewportGeoffrey  Lamke M.D.   On: 10/14/2014 08:25   Dg Wrist Complete Left  10/14/2014   CLINICAL DATA:  Left elbow and left wrist pain for 2 weeks  EXAM: LEFT WRIST - COMPLETE 3+ VIEW  COMPARISON:  None.  FINDINGS: Four views of the left wrist submitted. No acute fracture or subluxation. No radiopaque foreign body. Joint space is preserved.  IMPRESSION: Negative.   Electronically Signed   By: Natasha MeadLiviu  Pop M.D.   On: 10/14/2014 08:25     EKG Interpretation None      MDM   Final diagnoses:  Elbow pain  Overuse injury    39 year old female here with atraumatic left elbow pain. Worsening over the past 2 weeks. She is a housecleaner and right hand dominant. On exam diffuse tenderness of elbow and left wrist. Does not describe any nerve distribution pain. No effusion or swelling of elbow I can appreciate. Will x-ray elbow and wrist. I suspect this is overuse injury. Xrays ok. Given Sports-Med follow up, instructed to rest as much as possible if able. I re-examined her after xrays, she has a positive tinel and phalen sign. With this starting in the elbow, it seems unlikely to be carpal tunnel disease, however will give cock-up wrist splint to see if it helps.  Elwin MochaBlair Shaniece Bussa, MD 10/14/14 0840  Elwin MochaBlair Surabhi Gadea, MD 10/14/14 [phone]

## 2014-10-14 NOTE — ED Notes (Signed)
Left arm pain x 2 weeks without injury.  States pain is constant and sharp and moving arm worsens pain. Minimal relief with rest.

## 2014-10-14 NOTE — Discharge Instructions (Signed)

## 2014-10-14 NOTE — ED Notes (Signed)
Patient transported to X-ray 

## 2014-11-23 ENCOUNTER — Encounter (HOSPITAL_COMMUNITY): Payer: Self-pay | Admitting: Emergency Medicine

## 2014-11-23 ENCOUNTER — Emergency Department (INDEPENDENT_AMBULATORY_CARE_PROVIDER_SITE_OTHER)
Admission: EM | Admit: 2014-11-23 | Discharge: 2014-11-23 | Disposition: A | Discharge status: Home / Self Care | Payer: Medicaid Other | Source: Home / Self Care | Attending: Family Medicine | Admitting: Family Medicine

## 2014-11-23 DIAGNOSIS — B86 Scabies: Secondary | ICD-10-CM | POA: Diagnosis not present

## 2014-11-23 MED ORDER — PERMETHRIN 5 % EX CREA: TOPICAL_CREAM | CUTANEOUS | Status: DC

## 2014-11-23 NOTE — ED Notes (Signed)
Called in script to [facility] on [address]

## 2014-11-23 NOTE — Discharge Instructions (Signed)
Thank you for coming in today. °Apply the permethrin cream from the neck down at night and wash off in the morning. °Use Benadryl at night as needed for itching. °Use Gold Bond Itch as needed. °Come back if not getting better or worsening. ° ° °Scabies °Scabies are small bugs (mites) that burrow under the skin and cause red bumps and severe itching. These bugs can only be seen with a microscope. Scabies are highly contagious. They can spread easily from person to person by direct contact. They are also spread through sharing clothing or linens that have the scabies mites living in them. It is not unusual for an entire family to become infected through shared towels, clothing, or bedding.  °HOME CARE INSTRUCTIONS  °· Your caregiver may prescribe a cream or lotion to kill the mites. If cream is prescribed, massage the cream into the entire body from the neck to the bottom of both feet. Also massage the cream into the scalp and face if your child is less than 1 year old. Avoid the eyes and mouth. Do not wash your hands after application. °· Leave the cream on for 8 to 12 hours. Your child should bathe or shower after the 8 to 12 hour application period. Sometimes it is helpful to apply the cream to your child right before bedtime. °· One treatment is usually effective and will eliminate approximately 95% of infestations. For severe cases, your caregiver may decide to repeat the treatment in 1 week. Everyone in your household should be treated with one application of the cream. °· New rashes or burrows should not appear within 24 to 48 hours after successful treatment. However, the itching and rash may last for 2 to 4 weeks after successful treatment. Your caregiver may prescribe a medicine to help with the itching or to help the rash go away more quickly. °· Scabies can live on clothing or linens for up to 3 days. All of your child's recently used clothing, towels, stuffed toys, and bed linens should be washed in hot  water and then dried in a dryer for at least 20 minutes on high heat. Items that cannot be washed should be enclosed in a plastic bag for at least 3 days. °· To help relieve itching, bathe your child in a cool bath or apply cool washcloths to the affected areas. °· Your child may return to school after treatment with the prescribed cream. °SEEK MEDICAL CARE IF:  °· The itching persists longer than 4 weeks after treatment. °· The rash spreads or becomes infected. Signs of infection include red blisters or yellow-tan crust. °Document Released: 05/14/2005 Document Revised: 08/06/2011 Document Reviewed: 09/22/2008 °ExitCare® Patient Information ©2015 [employer]. This information is not intended to replace advice given to you by your health care provider. Make sure you discuss any questions you have with your health care provider. ° °

## 2014-11-23 NOTE — ED Notes (Signed)
Patient reports her children were seen and diagnosed with scabies yesterday.  Today patient is itching and has bumps on torso and extremities.

## 2014-11-23 NOTE — ED Provider Notes (Signed)
Karla Carey is a 39 y.o. female who presents to Urgent Care today for scabies. Patient has a pruritic papular rash on her extremities. This is been present for one day. Her children were diagnosed and treated for scabies yesterday. No fevers chills nausea vomiting or diarrhea. No treatments tried yet. No new substance or shampoos or medications.   Past Medical History  Diagnosis Date  . Asthma   . Depression    Past Surgical History  Procedure Laterality Date  . Cesarean section    . Tubal ligation    . Cholecystectomy     History  Substance Use Topics  . Smoking status: Current Every Day Smoker -- 1.00 packs/day    Types: Cigarettes  . Smokeless tobacco: Never Used  . Alcohol Use: No   ROS as above Medications: No current facility-administered medications for this encounter.   Current Outpatient Prescriptions  Medication Sig Dispense Refill  . albuterol (ACCUNEB) 1.25 MG/3ML nebulizer solution Take 1 ampule by nebulization every 6 (six) hours as needed for wheezing.    . ALBUTEROL SULFATE HFA IN Inhale into the lungs.    Marland Kitchen. HYDROcodone-acetaminophen (NORCO/VICODIN) 5-325 MG per tablet Take 1 tablet by mouth every 6 (six) hours as needed for moderate pain. 12 tablet 0  . permethrin (ELIMITE) 5 % cream Apply from the neck down at night and wash off in the morning once 180 g 1   Allergies  Allergen Reactions  . Asa Buff (Mag [Buffered Aspirin] Anaphylaxis  . Peanut Butter Flavor Anaphylaxis  . Codeine Other (See Comments)    Bad headache     Exam:  BP 123/74 mmHg  Pulse 90  Temp(Src) 98.7 F (37.1 C) (Oral)  Resp 18  SpO2 100% Gen: Well NAD HEENT: EOMI,  MMM Lungs: Normal work of breathing. CTABL Heart: RRR no MRG Abd: NABS, Soft. Nondistended, Nontender Exts: Brisk capillary refill, warm and well perfused.  Skin: Excoriated erythematous papular rash across trunk and extremities.  No results found for this or any previous visit (from the past 24 hour(s)). No  results found.  Assessment and Plan: 39 y.o. female with scabies. Treat with permethrin. Return as needed.  Discussed warning signs or symptoms. Please see discharge instructions. Patient expresses understanding.     Rodolph BongEvan S Sreya Froio, MD 11/23/14 [phone]

## 2014-11-25 ENCOUNTER — Telehealth (HOSPITAL_COMMUNITY): Payer: Self-pay | Admitting: Family Medicine

## 2014-11-25 MED ORDER — PERMETHRIN 5 % EX CREA: TOPICAL_CREAM | CUTANEOUS | Status: DC

## 2014-11-25 NOTE — ED Notes (Signed)
Patient did not have enough permethrin cream to treat her family. We'll call in some more  Rodolph BongEvan S Kveon Casanas, MD 11/25/14 [phone]

## 2014-12-16 ENCOUNTER — Emergency Department (HOSPITAL_COMMUNITY)
Admission: EM | Admit: 2014-12-16 | Discharge: 2014-12-16 | Disposition: A | Discharge status: Home / Self Care | Payer: Medicaid Other | Attending: Emergency Medicine | Admitting: Emergency Medicine

## 2014-12-16 ENCOUNTER — Encounter (HOSPITAL_COMMUNITY): Payer: Self-pay | Admitting: Emergency Medicine

## 2014-12-16 DIAGNOSIS — R05 Cough: Secondary | ICD-10-CM | POA: Diagnosis present

## 2014-12-16 DIAGNOSIS — Z79899 Other long term (current) drug therapy: Secondary | ICD-10-CM | POA: Insufficient documentation

## 2014-12-16 DIAGNOSIS — J45901 Unspecified asthma with (acute) exacerbation: Secondary | ICD-10-CM

## 2014-12-16 DIAGNOSIS — Z8659 Personal history of other mental and behavioral disorders: Secondary | ICD-10-CM | POA: Insufficient documentation

## 2014-12-16 DIAGNOSIS — Z720 Tobacco use: Secondary | ICD-10-CM | POA: Insufficient documentation

## 2014-12-16 DIAGNOSIS — B9789 Other viral agents as the cause of diseases classified elsewhere: Secondary | ICD-10-CM

## 2014-12-16 DIAGNOSIS — R059 Cough, unspecified: Secondary | ICD-10-CM

## 2014-12-16 DIAGNOSIS — J069 Acute upper respiratory infection, unspecified: Secondary | ICD-10-CM | POA: Insufficient documentation

## 2014-12-16 MED ORDER — ALBUTEROL SULFATE HFA 108 (90 BASE) MCG/ACT IN AERS
2.0000 | INHALATION_SPRAY | RESPIRATORY_TRACT | Status: DC | PRN
  Administered 2014-12-16: 2 via RESPIRATORY_TRACT
  Filled 2014-12-16: qty 6.7

## 2014-12-16 MED ORDER — ALBUTEROL SULFATE (2.5 MG/3ML) 0.083% IN NEBU
5.0000 mg | INHALATION_SOLUTION | Freq: Once | RESPIRATORY_TRACT | Status: AC
  Administered 2014-12-16: 5 mg via RESPIRATORY_TRACT
  Filled 2014-12-16: qty 6

## 2014-12-16 MED ORDER — IPRATROPIUM BROMIDE 0.02 % IN SOLN
0.5000 mg | Freq: Once | RESPIRATORY_TRACT | Status: AC
  Administered 2014-12-16: 0.5 mg via RESPIRATORY_TRACT
  Filled 2014-12-16: qty 2.5

## 2014-12-16 MED ORDER — PREDNISONE 20 MG PO TABS: 40.0000 mg | ORAL_TABLET | Freq: Every day | ORAL | Status: DC

## 2014-12-16 MED ORDER — AEROCHAMBER Z-STAT PLUS/MEDIUM MISC
1.0000 | Freq: Once | Status: AC
Start: 2014-12-16 — End: 2014-12-16
  Administered 2014-12-16: 1
  Filled 2014-12-16: qty 1

## 2014-12-16 MED ORDER — PREDNISONE 20 MG PO TABS
60.0000 mg | ORAL_TABLET | Freq: Once | ORAL | Status: AC
  Administered 2014-12-16: 60 mg via ORAL
  Filled 2014-12-16: qty 3

## 2014-12-16 MED ORDER — BENZONATATE 100 MG PO CAPS: 100.0000 mg | ORAL_CAPSULE | Freq: Three times a day (TID) | ORAL | Status: DC

## 2014-12-16 NOTE — ED Provider Notes (Signed)
CSN: [id number]     Arrival date & time 12/16/14  1223 History   This chart was scribed for Dierdre Forth, PA-C working with Gilda Crease, MD by Elveria Rising, ED Scribe. This patient was seen in room TR05C/TR05C and the patient's care was started at 1:12 PM.   Chief Complaint  Patient presents with  . Cough    The history is provided by the patient. No language interpreter was used.   HPI Comments: Karla Carey is a 39 y.o. female with PMHx of asthma who presents to the Emergency Department complaining of non productive cough, onset 2-3 days ago. Patient reports chest pain, tightness and shortness of breath with her persistent coughing. Patient does not have an at home inhaler; states she has never been prescribed one. Patient denies history of chronic illnesses.  Past Medical History  Diagnosis Date  . Asthma   . Depression    Past Surgical History  Procedure Laterality Date  . Cesarean section    . Tubal ligation    . Cholecystectomy     No family history on file. History  Substance Use Topics  . Smoking status: Current Every Day Smoker -- 1.00 packs/day    Types: Cigarettes  . Smokeless tobacco: Never Used  . Alcohol Use: No   OB History    No data available     Review of Systems  Constitutional: Positive for chills. Negative for fever, appetite change and fatigue.  HENT: Negative for congestion, ear discharge, ear pain, mouth sores, postnasal drip, rhinorrhea, sinus pressure and sore throat.   Eyes: Negative for visual disturbance.  Respiratory: Positive for cough, chest tightness and wheezing. Negative for shortness of breath and stridor.   Cardiovascular: Negative for chest pain, palpitations and leg swelling.  Gastrointestinal: Negative for nausea, vomiting, abdominal pain and diarrhea.  Genitourinary: Negative for dysuria, urgency, frequency and hematuria.  Musculoskeletal: Negative for myalgias, back pain, arthralgias and neck stiffness.  Skin:  Negative for rash.  Neurological: Negative for syncope, light-headedness, numbness and headaches.  Hematological: Negative for adenopathy.  Psychiatric/Behavioral: The patient is not nervous/anxious.   All other systems reviewed and are negative.   Allergies  Asa buff (mag; Peanut butter flavor; and Codeine  Home Medications   Prior to Admission medications   Medication Sig Start Date End Date Taking? Authorizing Provider  albuterol (ACCUNEB) 1.25 MG/3ML nebulizer solution Take 1 ampule by nebulization every 6 (six) hours as needed for wheezing.    Historical Provider, MD  ALBUTEROL SULFATE HFA IN Inhale into the lungs.    Historical Provider, MD  benzonatate (TESSALON) 100 MG capsule Take 1 capsule (100 mg total) by mouth every 8 (eight) hours. 12/16/14   Shevonne Wolf, PA-C  HYDROcodone-acetaminophen (NORCO/VICODIN) 5-325 MG per tablet Take 1 tablet by mouth every 6 (six) hours as needed for moderate pain. 10/14/14   Elwin Mocha, MD  permethrin (ELIMITE) 5 % cream Apply from the neck down at night and wash off in the morning once 11/25/14   Rodolph Bong, MD  predniSONE (DELTASONE) 20 MG tablet Take 2 tablets (40 mg total) by mouth daily. 12/16/14   Moncerrath Berhe, PA-C   Triage Vitals: BP 119/72 mmHg  Pulse 83  Temp(Src) 98.5 F (36.9 C) (Oral)  Resp 17  Ht  (1.499 m)  Wt 207 lb 5 oz (94.036 kg)  BMI 41.85 kg/m2  SpO2 99%  LMP 12/10/2014 Physical Exam  Constitutional: She is oriented to person, place, and time. She appears  well-developed and well-nourished. No distress.  HENT:  Head: Normocephalic and atraumatic.  Right Ear: Tympanic membrane, external ear and ear canal normal.  Left Ear: Tympanic membrane, external ear and ear canal normal.  Nose: Mucosal edema and rhinorrhea present. No epistaxis. Right sinus exhibits no maxillary sinus tenderness and no frontal sinus tenderness. Left sinus exhibits no maxillary sinus tenderness and no frontal sinus tenderness.   Mouth/Throat: Uvula is midline and mucous membranes are normal. Mucous membranes are not pale and not cyanotic. No oropharyngeal exudate, posterior oropharyngeal edema, posterior oropharyngeal erythema or tonsillar abscesses.  Eyes: Conjunctivae are normal. Pupils are equal, round, and reactive to light.  Neck: Normal range of motion and full passive range of motion without pain.  Cardiovascular: Normal rate and intact distal pulses.   Pulmonary/Chest: Effort normal. No stridor.  Very fine expiratory wheezes and slightly diminished breath sounds. Intermittent dry cough.   Abdominal: Soft. Bowel sounds are normal. There is no tenderness.  Musculoskeletal: Normal range of motion.  Lymphadenopathy:    She has no cervical adenopathy.  Neurological: She is alert and oriented to person, place, and time.  Skin: Skin is warm and dry. No rash noted. She is not diaphoretic.  Psychiatric: She has a normal mood and affect.  Nursing note and vitals reviewed.   ED Course  Procedures (including critical care time)  COORDINATION OF CARE: 1:16 PM- Discussed treatment plan with patient at bedside and patient agreed to plan.   Labs Review Labs Reviewed - No data to display  Imaging Review No results found.   EKG Interpretation None      MDM   Final diagnoses:  Cough  Asthma exacerbation  Viral URI with cough   Karla Carey presents with Hx and PE consistent with viral URI and asthma exacerbation.  Will give prednisone and albuterol.    2:07 PM Pt with clear and equal breath sounds with improved tidal volume after nenbulizer.  Patient ambulated in ED with O2 saturations maintained >90, no current signs of respiratory distress. Lung exam improved after nebulizer treatment. Prednisone given in the ED and pt will be dc with 5 day burst. Pt states they are breathing at baseline. Pt has been instructed to continue using prescribed medications and to speak with PCP about today's exacerbation.    BP 119/72 mmHg  Pulse 83  Temp(Src) 98.5 F (36.9 C) (Oral)  Resp 17  Ht 4\' 11"  (1.499 m)  Wt 207 lb 5 oz (94.036 kg)  BMI 41.85 kg/m2  SpO2 99%  LMP 12/10/2014  I personally performed the services described in this documentation, which was scribed in my presence. The recorded information has been reviewed and is accurate.    Dahlia Client Thurza Kwiecinski, PA-C 12/16/14 1408  Gilda Crease, MD 12/17/14 [phone]

## 2014-12-16 NOTE — Discharge Instructions (Signed)
1. Medications: albuterol, prednisone, usual home medications °2. Treatment: rest, drink plenty of fluids, begin OTC antihistamine (Zyrtec or Claritin)  °3. Follow Up: Please followup with your primary doctor in 2-3 days for discussion of your diagnoses and further evaluation after today's visit; if you do not have a primary care doctor use the resource guide provided to find one; Please return to the ER for difficulty breathing, high fevers or worsening symptoms. ° ° ° °Asthma, Acute Bronchospasm °Acute bronchospasm caused by asthma is also referred to as an asthma attack. Bronchospasm means your air passages become narrowed. The narrowing is caused by inflammation and tightening of the muscles in the air tubes (bronchi) in your lungs. This can make it hard to breathe or cause you to wheeze and cough. °CAUSES °Possible triggers are: °· Animal dander from the skin, hair, or feathers of animals. °· Dust mites contained in house dust. °· Cockroaches. °· Pollen from trees or grass. °· Mold. °· Cigarette or tobacco smoke. °· Air pollutants such as dust, household cleaners, hair sprays, aerosol sprays, paint fumes, strong chemicals, or strong odors. °· Cold air or weather changes. Cold air may trigger inflammation. Winds increase molds and pollens in the air. °· Strong emotions such as crying or laughing hard. °· Stress. °· Certain medicines such as aspirin or beta-blockers. °· Sulfites in foods and drinks, such as dried fruits and wine. °· Infections or inflammatory conditions, such as a flu, cold, or inflammation of the nasal membranes (rhinitis). °· Gastroesophageal reflux disease (GERD). GERD is a condition where stomach acid backs up into your esophagus. °· Exercise or strenuous activity. °SIGNS AND SYMPTOMS  °· Wheezing. °· Excessive coughing, particularly at night. °· Chest tightness. °· Shortness of breath. °DIAGNOSIS  °Your health care provider will ask you about your medical history and perform a physical exam.  A chest X-ray or blood testing may be performed to look for other causes of your symptoms or other conditions that may have triggered your asthma attack.  °TREATMENT  °Treatment is aimed at reducing inflammation and opening up the airways in your lungs.  Most asthma attacks are treated with inhaled medicines. These include quick relief or rescue medicines (such as bronchodilators) and controller medicines (such as inhaled corticosteroids). These medicines are sometimes given through an inhaler or a nebulizer. Systemic steroid medicine taken by mouth or given through an IV tube also can be used to reduce the inflammation when an attack is moderate or severe. Antibiotic medicines are only used if a bacterial infection is present.  °HOME CARE INSTRUCTIONS  °· Rest. °· Drink plenty of liquids. This helps the mucus to remain thin and be easily coughed up. Only use caffeine in moderation and do not use alcohol until you have recovered from your illness. °· Do not smoke. Avoid being exposed to secondhand smoke. °· You play a critical role in keeping yourself in good health. Avoid exposure to things that cause you to wheeze or to have breathing problems. °· Keep your medicines up-to-date and available. Carefully follow your health care provider's treatment plan. °· Take your medicine exactly as prescribed. °· When pollen or pollution is bad, keep windows closed and use an air conditioner or go to places with air conditioning. °· Asthma requires careful medical care. See your health care provider for a follow-up as advised. If you are more than [redacted] weeks pregnant and you were prescribed any new medicines, let your obstetrician know about the visit and how you are doing. Follow up with   your health care provider as directed. °· After you have recovered from your asthma attack, make an appointment with your outpatient doctor to talk about ways to reduce the likelihood of future attacks. If you do not have a doctor who manages  your asthma, make an appointment with a primary care doctor to discuss your asthma. °SEEK IMMEDIATE MEDICAL CARE IF:  °· You are getting worse. °· You have trouble breathing. If severe, call your local emergency services (911 in the [place] °· You develop chest pain or discomfort. °· You are vomiting. °· You are not able to keep fluids down. °· You are coughing up yellow, green, brown, or bloody sputum. °· You have a fever and your symptoms suddenly get worse. °· You have trouble swallowing. °MAKE SURE YOU:  °· Understand these instructions. °· Will watch your condition. °· Will get help right away if you are not doing well or get worse. °Document Released: 08/29/2006 Document Revised: 05/19/2013 Document Reviewed: 11/19/2012 °Exit[employer]® Patient Information ©2015 [employer]. This information is not intended to replace advice given to you by your health care provider. Make sure you discuss any questions you have with your health care provider. ° ° °

## 2014-12-16 NOTE — ED Notes (Signed)
Pt. Stated, I have a cough making me not breath.

## 2015-02-09 ENCOUNTER — Encounter (HOSPITAL_COMMUNITY): Payer: Self-pay | Admitting: Emergency Medicine

## 2015-02-09 ENCOUNTER — Emergency Department (INDEPENDENT_AMBULATORY_CARE_PROVIDER_SITE_OTHER)
Admission: EM | Admit: 2015-02-09 | Discharge: 2015-02-09 | Disposition: A | Discharge status: Home / Self Care | Payer: Medicaid Other | Source: Home / Self Care | Attending: Family Medicine | Admitting: Family Medicine

## 2015-02-09 DIAGNOSIS — R21 Rash and other nonspecific skin eruption: Secondary | ICD-10-CM | POA: Diagnosis not present

## 2015-02-09 MED ORDER — TRIAMCINOLONE ACETONIDE 0.5 % EX OINT: 1.0000 "application " | TOPICAL_OINTMENT | Freq: Two times a day (BID) | CUTANEOUS | Status: DC

## 2015-02-09 NOTE — Discharge Instructions (Signed)
The cause of your symptoms is not immediately clear. This may be due to simple skin irritation from her environment as you may have very sensitive skin or from an underlying autoimmune disorder. Please start using the steroid ointment as prescribed. You may also use 25-50 mg of Benadryl every night for additional itch relief. Please follow-up with your primary care physician or with a dermatologist for further recommendations.

## 2015-02-09 NOTE — ED Notes (Signed)
Pt. States she has been suffering from itchy skin on both arms for the past nine years.  She came in today because it is keeping her up at night.  Pt states it happens mostly in the heat and her arms get very dry and flaky.

## 2015-02-09 NOTE — ED Provider Notes (Signed)
CSN: [id number]     Arrival date & time 02/09/15  1430 History   First MD Initiated Contact with Patient 02/09/15 1527     Chief Complaint  Patient presents with  . Pruritis   (Consider location/radiation/quality/duration/timing/severity/associated sxs/prior Treatment) HPI  Rash. On arms bilaterally. Started 9 years ago after the birth of her first child. Comes and goes. Worse in the summer months and in daylight. Very itchy, and occasionally burns. Occasionally white and flaky. Improves with cold weather and air conditioning. Cortizone 10 with some improvement. Denies any joint effusions, other rash symptoms, chest pain, fevers, nausea, vomiting, shortness of breath, chest pain.  Past Medical History  Diagnosis Date  . Asthma   . Depression    Past Surgical History  Procedure Laterality Date  . Cesarean section    . Tubal ligation    . Cholecystectomy     History reviewed. No pertinent family history. Social History  Substance Use Topics  . Smoking status: Current Every Day Smoker -- 1.00 packs/day    Types: Cigarettes  . Smokeless tobacco: Never Used  . Alcohol Use: No   OB History    No data available     Review of Systems Per HPI with all other pertinent systems negative.   Allergies  Asa buff (mag; Peanut butter flavor; and Codeine  Home Medications   Prior to Admission medications   Medication Sig Start Date End Date Taking? Authorizing Provider  albuterol (ACCUNEB) 1.25 MG/3ML nebulizer solution Take 1 ampule by nebulization every 6 (six) hours as needed for wheezing.    Historical Provider, MD  ALBUTEROL SULFATE HFA IN Inhale into the lungs.    Historical Provider, MD  benzonatate (TESSALON) 100 MG capsule Take 1 capsule (100 mg total) by mouth every 8 (eight) hours. 12/16/14   Hannah Muthersbaugh, PA-C  HYDROcodone-acetaminophen (NORCO/VICODIN) 5-325 MG per tablet Take 1 tablet by mouth every 6 (six) hours as needed for moderate pain. 10/14/14   Elwin Mocha, MD   permethrin (ELIMITE) 5 % cream Apply from the neck down at night and wash off in the morning once 11/25/14   Rodolph Bong, MD  predniSONE (DELTASONE) 20 MG tablet Take 2 tablets (40 mg total) by mouth daily. 12/16/14   Hannah Muthersbaugh, PA-C  triamcinolone ointment (KENALOG) 0.5 % Apply 1 application topically 2 (two) times daily. Treat for 5-7 days then stop for 7 days 02/09/15   Ozella Rocks, MD   Meds Ordered and Administered this Visit  Medications - No data to display  BP 105/67 mmHg  Pulse 83  Temp(Src) 98.1 F (36.7 C) (Oral)  SpO2 98%  LMP 01/31/2015 (Within Days) No data found.   Physical Exam Physical Exam  Constitutional: oriented to person, place, and time. appears well-developed and well-nourished. No distress.  HENT:  Head: Normocephalic and atraumatic.  Eyes: EOMI. PERRL.  Neck: Normal range of motion.  Cardiovascular: RRR, no m/r/g, 2+ distal pulses,  Pulmonary/Chest: Effort normal and breath sounds normal. No respiratory distress.  Abdominal: Soft. Bowel sounds are normal. NonTTP, no distension.  Musculoskeletal: Normal range of motion. Non ttp, no effusion.  Neurological: alert and oriented to person, place, and time.  Skin: A few very small erythematous macules on arms. No appreciable flaking of skin or open lesions.  Psychiatric: normal mood and affect. behavior is normal. Judgment and thought content normal.   ED Course  Procedures (including critical care time)  Labs Review Labs Reviewed - No data to display  Imaging Review  No results found.   Visual Acuity Review  Right Eye Distance:   Left Eye Distance:   Bilateral Distance:    Right Eye Near:   Left Eye Near:    Bilateral Near:         MDM   1. Rash    Etiology not immediately clear. Patient's initial night for additional itch relief symptoms. We'll prescribe triamcinolone ointment. Recommending patient follow-up with dermatology Dr. Terri Piedra.    Ozella Rocks, MD 02/09/15  [phone]

## 2017-03-04 ENCOUNTER — Emergency Department (HOSPITAL_COMMUNITY): Payer: Medicaid Other

## 2017-03-04 ENCOUNTER — Other Ambulatory Visit: Payer: Self-pay

## 2017-03-04 ENCOUNTER — Emergency Department (HOSPITAL_BASED_OUTPATIENT_CLINIC_OR_DEPARTMENT_OTHER)
Admit: 2017-03-04 | Discharge: 2017-03-04 | Disposition: A | Discharge status: Home / Self Care | Payer: Medicaid Other | Attending: Emergency Medicine | Admitting: Emergency Medicine

## 2017-03-04 ENCOUNTER — Emergency Department (HOSPITAL_COMMUNITY)
Admission: EM | Admit: 2017-03-04 | Discharge: 2017-03-04 | Disposition: A | Discharge status: Home / Self Care | Payer: Medicaid Other | Attending: Emergency Medicine | Admitting: Emergency Medicine

## 2017-03-04 ENCOUNTER — Encounter (HOSPITAL_COMMUNITY): Payer: Self-pay

## 2017-03-04 DIAGNOSIS — R2242 Localized swelling, mass and lump, left lower limb: Secondary | ICD-10-CM | POA: Diagnosis present

## 2017-03-04 DIAGNOSIS — R609 Edema, unspecified: Secondary | ICD-10-CM | POA: Diagnosis not present

## 2017-03-04 DIAGNOSIS — M79609 Pain in unspecified limb: Secondary | ICD-10-CM

## 2017-03-04 DIAGNOSIS — J4520 Mild intermittent asthma, uncomplicated: Secondary | ICD-10-CM | POA: Insufficient documentation

## 2017-03-04 DIAGNOSIS — F17210 Nicotine dependence, cigarettes, uncomplicated: Secondary | ICD-10-CM | POA: Diagnosis not present

## 2017-03-04 DIAGNOSIS — Z91010 Allergy to peanuts: Secondary | ICD-10-CM | POA: Insufficient documentation

## 2017-03-04 DIAGNOSIS — R0602 Shortness of breath: Secondary | ICD-10-CM | POA: Diagnosis not present

## 2017-03-04 DIAGNOSIS — L03116 Cellulitis of left lower limb: Secondary | ICD-10-CM | POA: Diagnosis not present

## 2017-03-04 LAB — CBC WITH DIFFERENTIAL/PLATELET
BASOS PCT: 0 %
Basophils Absolute: 0 10*3/uL (ref 0.0–0.1)
EOS ABS: 0.4 10*3/uL (ref 0.0–0.7)
EOS PCT: 5 %
HCT: 30.9 % — ABNORMAL LOW (ref 36.0–46.0)
Hemoglobin: 8.7 g/dL — ABNORMAL LOW (ref 12.0–15.0)
Lymphocytes Relative: 21 %
Lymphs Abs: 1.8 10*3/uL (ref 0.7–4.0)
MCH: 20.9 pg — ABNORMAL LOW (ref 26.0–34.0)
MCHC: 28.2 g/dL — AB (ref 30.0–36.0)
MCV: 74.3 fL — ABNORMAL LOW (ref 78.0–100.0)
Monocytes Absolute: 0.5 10*3/uL (ref 0.1–1.0)
Monocytes Relative: 6 %
NEUTROS PCT: 68 %
Neutro Abs: 5.9 10*3/uL (ref 1.7–7.7)
PLATELETS: 251 10*3/uL (ref 150–400)
RBC: 4.16 MIL/uL (ref 3.87–5.11)
RDW: 17.3 % — ABNORMAL HIGH (ref 11.5–15.5)
WBC: 8.6 10*3/uL (ref 4.0–10.5)

## 2017-03-04 LAB — COMPREHENSIVE METABOLIC PANEL
ALK PHOS: 66 U/L (ref 38–126)
ALT: 13 U/L — ABNORMAL LOW (ref 14–54)
ANION GAP: 7 (ref 5–15)
AST: 15 U/L (ref 15–41)
Albumin: 3.7 g/dL (ref 3.5–5.0)
BUN: 11 mg/dL (ref 6–20)
CO2: 23 mmol/L (ref 22–32)
CREATININE: 0.78 mg/dL (ref 0.44–1.00)
Calcium: 9.5 mg/dL (ref 8.9–10.3)
Chloride: 104 mmol/L (ref 101–111)
GFR calc Af Amer: >60 mL/min (ref >60)
GFR calc non Af Amer: >60 mL/min (ref >60)
Glucose, Bld: 98 mg/dL (ref 65–99)
Potassium: 4.2 mmol/L (ref 3.5–5.1)
SODIUM: 134 mmol/L — AB (ref 135–145)
Total Bilirubin: 0.5 mg/dL (ref 0.3–1.2)
Total Protein: 6.5 g/dL (ref 6.5–8.1)

## 2017-03-04 LAB — URINALYSIS, ROUTINE W REFLEX MICROSCOPIC
Bilirubin Urine: NEGATIVE
Glucose, UA: NEGATIVE mg/dL
HGB URINE DIPSTICK: NEGATIVE
Ketones, ur: NEGATIVE mg/dL
Leukocytes, UA: NEGATIVE
Nitrite: NEGATIVE
PROTEIN: NEGATIVE mg/dL
Specific Gravity, Urine: 1.013 (ref 1.005–1.030)
pH: 5 (ref 5.0–8.0)

## 2017-03-04 LAB — I-STAT TROPONIN, ED: Troponin i, poc: 0 ng/mL (ref 0.00–0.08)

## 2017-03-04 LAB — D-DIMER, QUANTITATIVE (NOT AT ARMC): D DIMER QUANT: 1.46 ug{FEU}/mL — AB (ref 0.00–0.50)

## 2017-03-04 LAB — BRAIN NATRIURETIC PEPTIDE: B NATRIURETIC PEPTIDE 5: 66.5 pg/mL (ref 0.0–100.0)

## 2017-03-04 MED ORDER — ALBUTEROL SULFATE HFA 108 (90 BASE) MCG/ACT IN AERS
2.0000 | INHALATION_SPRAY | Freq: Once | RESPIRATORY_TRACT | Status: AC
  Administered 2017-03-04: 2 via RESPIRATORY_TRACT
  Filled 2017-03-04: qty 6.7

## 2017-03-04 MED ORDER — IOPAMIDOL (ISOVUE-370) INJECTION 76%
INTRAVENOUS | Status: AC
  Administered 2017-03-04: 100 mL
  Filled 2017-03-04: qty 100

## 2017-03-04 MED ORDER — DEXAMETHASONE SODIUM PHOSPHATE 10 MG/ML IJ SOLN
10.0000 mg | Freq: Once | INTRAMUSCULAR | Status: AC
  Administered 2017-03-04: 10 mg via INTRAVENOUS
  Filled 2017-03-04: qty 1

## 2017-03-04 MED ORDER — HYDROCODONE-ACETAMINOPHEN 5-325 MG PO TABS: 1.0000 | ORAL_TABLET | Freq: Four times a day (QID) | ORAL | 0 refills | Status: DC | PRN

## 2017-03-04 MED ORDER — DOXYCYCLINE HYCLATE 100 MG PO CAPS: 100.0000 mg | ORAL_CAPSULE | Freq: Two times a day (BID) | ORAL | 0 refills | Status: AC

## 2017-03-04 MED ORDER — HYDROCODONE-ACETAMINOPHEN 5-325 MG PO TABS
2.0000 | ORAL_TABLET | Freq: Once | ORAL | Status: AC
  Administered 2017-03-04: 2 via ORAL
  Filled 2017-03-04: qty 2

## 2017-03-04 NOTE — Progress Notes (Signed)
Preliminary results by tech - Venous Duplex Lower Ext. Completed. Negative for deep and superficial vein thrombosis. Bilateral small in size Baker's Cysts were noted.  Marilynne Halsted, BS, RDMS, RVT

## 2017-03-04 NOTE — ED Notes (Signed)
Got patient undress did ekg shown to Dr Erma Heritage

## 2017-03-04 NOTE — ED Notes (Signed)
ED Provider at bedside. 

## 2017-03-04 NOTE — ED Notes (Signed)
Patient transported to Ultrasound 

## 2017-03-04 NOTE — Discharge Instructions (Signed)
It is VERY important that you follow-up with a primary care doctor to discuss your complaints.  Try one of the numbers above. Follow-up within a week.  Your XRay did show signs of possible COPD, related to your smoking. I have given you an inhaler - use this as needed for shortness of breath.

## 2017-03-04 NOTE — ED Provider Notes (Signed)
[facility] Provider Note   CSN: [id number] Arrival date & time: 03/04/17  4098     History   Chief Complaint Chief Complaint  Patient presents with  . Leg Swelling    HPI Karla Carey is a 41 y.o. female.  HPI  40 year old female with past medical history as below here with bilateral and now left greater than right leg swelling. The patient states that her symptoms started approximately 1 month ago. She first noticed bilateral leg swelling that was mildly painful. Since then, her right ankle has improved but she has had persistent left ankle and leg swelling and pain. She shows a pain as an aching, throbbing sensation that is now extending throughout her entire left leg. She is also noticed some mild shortness of breath with exertion as well as generalized fatigue. She denies any associated fevers. No cough or hemoptysis. She is not on estrogen or oral contraceptives. Denies any recent immobilizations or surgeries. No trauma. She does have a family history of heart disease and is concerned about possible heart failure. Denies any orthopnea. She does not see a primary care doctor.  Past Medical History:  Diagnosis Date  . Asthma   . Depression     There are no active problems to display for this patient.   Past Surgical History:  Procedure Laterality Date  . CESAREAN SECTION    . CHOLECYSTECTOMY    . TUBAL LIGATION      OB History    No data available       Home Medications    Prior to Admission medications   Medication Sig Start Date End Date Taking? Authorizing Provider  acetaminophen (TYLENOL) 325 MG tablet Take 650 mg by mouth every 6 (six) hours as needed for mild pain.   Yes [provider]  benzonatate (TESSALON) 100 MG capsule Take 1 capsule (100 mg total) by mouth every 8 (eight) hours. Patient not taking: Reported on 03/04/2017 12/16/14   Muthersbaugh, Dahlia Client, PA-C  doxycycline (VIBRAMYCIN) 100 MG capsule Take 1 capsule (100 mg total) by  mouth 2 (two) times daily. 03/04/17 03/11/17  Shaune Pollack, MD  HYDROcodone-acetaminophen (NORCO/VICODIN) 5-325 MG tablet Take 1-2 tablets by mouth every 6 (six) hours as needed for severe pain. 03/04/17   Shaune Pollack, MD  permethrin (ELIMITE) 5 % cream Apply from the neck down at night and wash off in the morning once Patient not taking: Reported on 03/04/2017 11/25/14   Rodolph Bong, MD  predniSONE (DELTASONE) 20 MG tablet Take 2 tablets (40 mg total) by mouth daily. Patient not taking: Reported on 03/04/2017 12/16/14   Muthersbaugh, Dahlia Client, PA-C  triamcinolone ointment (KENALOG) 0.5 % Apply 1 application topically 2 (two) times daily. Treat for 5-7 days then stop for 7 days Patient not taking: Reported on 03/04/2017 02/09/15   Ozella Rocks, MD    Family History History reviewed. No pertinent family history.  Social History Social History  Substance Use Topics  . Smoking status: Current Every Day Smoker    Packs/day: 1.00    Types: Cigarettes  . Smokeless tobacco: Never Used  . Alcohol use No     Allergies   Asa buff (mag [buffered aspirin]; Peanut butter flavor; and Codeine   Review of Systems Review of Systems  Constitutional: Positive for fatigue.  Respiratory: Positive for shortness of breath.   Cardiovascular: Positive for leg swelling.  Neurological: Positive for weakness.  All other systems reviewed and are negative.    Physical Exam Updated  Vital Signs BP 115/71 (BP Location: Right Arm)   Pulse 70   Temp 98.4 F (36.9 C) (Oral)   Resp 16   LMP 02/27/2017 (Within Days)   SpO2 99%   Physical Exam  Constitutional: She is oriented to person, place, and time. She appears well-developed and well-nourished. No distress.  HENT:  Head: Normocephalic and atraumatic.  Eyes: Conjunctivae are normal.  Neck: Neck supple.  Cardiovascular: Normal rate, regular rhythm and normal heart sounds.  Exam reveals no friction rub.   No murmur heard. Pulmonary/Chest:  Effort normal and breath sounds normal. No respiratory distress. She has no wheezes. She has no rales.  Abdominal: Soft. She exhibits no distension.  Musculoskeletal: She exhibits edema.  Mild erythema and tenderness throughout the left lower extremity distal to the knee. There is significant popliteal tenderness. 2+ DP and PT pulses bilaterally. No right lower extremity swelling or tenderness. No pallor or cyanosis.  Neurological: She is alert and oriented to person, place, and time. She exhibits normal muscle tone.  Skin: Skin is warm. Capillary refill takes less than 2 seconds.  Psychiatric: She has a normal mood and affect.  Nursing note and vitals reviewed.    ED Treatments / Results  Labs (all labs ordered are listed, but only abnormal results are displayed) Labs Reviewed  COMPREHENSIVE METABOLIC PANEL - Abnormal; Notable for the following:       Result Value   Sodium 134 (*)    ALT 13 (*)    All other components within normal limits  CBC WITH DIFFERENTIAL/PLATELET - Abnormal; Notable for the following:    Hemoglobin 8.7 (*)    HCT 30.9 (*)    MCV 74.3 (*)    MCH 20.9 (*)    MCHC 28.2 (*)    RDW 17.3 (*)    All other components within normal limits  D-DIMER, QUANTITATIVE (NOT AT [facility]) - Abnormal; Notable for the following:    D-Dimer, Quant 1.46 (*)    All other components within normal limits  BRAIN NATRIURETIC PEPTIDE  URINALYSIS, ROUTINE W REFLEX MICROSCOPIC  I-STAT TROPONIN, ED    EKG  EKG Interpretation  Date/Time:  Monday March 04 2017 09:56:06 EDT Ventricular Rate:  75 PR Interval:    QRS Duration: 91 QT Interval:  368 QTC Calculation: 411 R Axis:   50 Text Interpretation:  Sinus rhythm Borderline prolonged PR interval Low voltage, precordial leads No old tracing to compare No ST elevations or depressions Confirmed by Shaune Pollack [phone]) on 03/04/2017 9:59:52 AM       Radiology Ct Angio Chest Pe W Or Wo Contrast  Result Date: 03/04/2017 CLINICAL  DATA:  Leg swelling and elevated D-dimer. Question pulmonary embolus. EXAM: CT ANGIOGRAPHY CHEST WITH CONTRAST TECHNIQUE: Multidetector CT imaging of the chest was performed using the standard protocol during bolus administration of intravenous contrast. Multiplanar CT image reconstructions and MIPs were obtained to evaluate the vascular anatomy. CONTRAST:  100 cc Isovue 370 COMPARISON:  None. FINDINGS: Cardiovascular: The heart size is normal. No pericardial effusion. Coronary artery calcification is evident. Atherosclerotic calcification is noted in the wall of the thoracic aorta. No filling defect in the opacified pulmonary arteries to suggest the presence of an acute pulmonary embolus. Mediastinum/Nodes: No mediastinal lymphadenopathy. No evidence for gross hilar lymphadenopathy although assessment is limited by the lack of intravenous contrast on today's study. Lungs/Pleura: Subtle areas of mosaic attenuation suggests air trapping. No focal airspace consolidation. No pulmonary edema or pleural effusion. Upper Abdomen: Unremarkable. Musculoskeletal: Bone  windows reveal no worrisome lytic or sclerotic osseous lesions. Review of the MIP images confirms the above findings. IMPRESSION: 1. No CT evidence for acute pulmonary embolus. 2. Subtle areas of mosaic attenuation suggests air trapping. Small airways disease could have this appearance. 3.  Aortic Atherosclerois (ICD10-170.0) Electronically Signed   By: Kennith Center M.D.   On: 03/04/2017 13:34    Procedures Procedures (including critical care time)  Medications Ordered in ED Medications  HYDROcodone-acetaminophen (NORCO/VICODIN) 5-325 MG per tablet 2 tablet (2 tablets Oral Given 03/04/17 0955)  iopamidol (ISOVUE-370) 76 % injection (100 mLs  Contrast Given 03/04/17 1320)  dexamethasone (DECADRON) injection 10 mg (10 mg Intravenous Given 03/04/17 1432)  albuterol (PROVENTIL HFA;VENTOLIN HFA) 108 (90 Base) MCG/ACT inhaler 2 puff (2 puffs Inhalation Given  03/04/17 1431)     Initial Impression / Assessment and Plan / ED Course  I have reviewed the triage vital signs and the nursing notes.  Pertinent labs & imaging results that were available during my care of the patient were reviewed by me and considered in my medical decision making (see chart for details).     41 yo F with PMhx as above here with b/l LE edema. Suspect dependent edema with possible mild cellulitis of LLE. DVT studies neg. D-Dimer positive but U/S neg, CT Angio neg and do not suspect PE. She does have some mild wheeze, h/o tobacco use and has been given steroids, albuterol inhaler here. No signs of arterial insufficiency or vascular compromise. No LBP or sx to suggest lumbar radiculopathy/spinal etiology. Pt has multiple other complaints that are chronic - will need PCP f/u. No acute emergent pathology at this time.  Final Clinical Impressions(s) / ED Diagnoses   Final diagnoses:  Peripheral edema  Cellulitis of left lower extremity  Mild intermittent reactive airway disease without complication    New Prescriptions Discharge Medication List as of 03/04/2017  2:40 PM    START taking these medications   Details  doxycycline (VIBRAMYCIN) 100 MG capsule Take 1 capsule (100 mg total) by mouth 2 (two) times daily., Starting Mon 03/04/2017, Until Mon 03/11/2017, Print         Shaune Pollack, MD 03/05/17 [phone]

## 2017-03-04 NOTE — ED Triage Notes (Signed)
Pt reports left leg swelling X1 month. She reports her right leg was swollen but eventually subsided. No pitting edema noted. Resp e/u. Pt denies hx of CHF but reports family hx.

## 2017-03-04 NOTE — ED Notes (Signed)
Walked patient to the bathroom  

## 2017-11-08 ENCOUNTER — Encounter (HOSPITAL_COMMUNITY): Payer: Self-pay | Admitting: Family Medicine

## 2017-11-08 ENCOUNTER — Ambulatory Visit (HOSPITAL_COMMUNITY)
Admission: EM | Admit: 2017-11-08 | Discharge: 2017-11-08 | Disposition: A | Discharge status: Home / Self Care | Payer: Medicaid Other | Attending: Emergency Medicine | Admitting: Emergency Medicine

## 2017-11-08 DIAGNOSIS — B850 Pediculosis due to Pediculus humanus capitis: Secondary | ICD-10-CM | POA: Diagnosis not present

## 2017-11-08 MED ORDER — PERMETHRIN 5 % EX CREA: TOPICAL_CREAM | CUTANEOUS | 0 refills | Status: DC

## 2017-11-08 NOTE — ED Triage Notes (Signed)
Pt here to be checked for lice. Reports that both of her children had it.

## 2017-11-08 NOTE — Discharge Instructions (Signed)
Please apply permethrin to hair, leave for 10 minutes, rinse with warm water over sink  Repeat in 9 days  Return if not getting better

## 2017-11-08 NOTE — ED Provider Notes (Signed)
[facility]    CSN: [id number] Arrival date & time: 11/08/17  1610     History   Chief Complaint Chief Complaint  Patient presents with  . Head Lice    HPI Karla Carey is a 42 y.o. female history of asthma and depression presenting today for evaluation of possible head lice.  Her 2 daughters have both had nits and lice and treated with permethrin.  She is also done over-the-counter treatments as well as washing hair with Listerine, she states since this her symptoms have subsided and has not had any itching.  She is just concerned if she still has lice.  HPI  Past Medical History:  Diagnosis Date  . Asthma   . Depression     There are no active problems to display for this patient.   Past Surgical History:  Procedure Laterality Date  . CESAREAN SECTION    . CHOLECYSTECTOMY    . TUBAL LIGATION      OB History   None      Home Medications    Prior to Admission medications   Medication Sig Start Date End Date Taking? Authorizing Provider  acetaminophen (TYLENOL) 325 MG tablet Take 650 mg by mouth every 6 (six) hours as needed for mild pain.    [provider]  benzonatate (TESSALON) 100 MG capsule Take 1 capsule (100 mg total) by mouth every 8 (eight) hours. Patient not taking: Reported on 03/04/2017 12/16/14   Muthersbaugh, Dahlia Client, PA-C  HYDROcodone-acetaminophen (NORCO/VICODIN) 5-325 MG tablet Take 1-2 tablets by mouth every 6 (six) hours as needed for severe pain. 03/04/17   Shaune Pollack, MD  permethrin (ELIMITE) 5 % cream Apply to affected area once 11/08/17   Navin Dogan C, PA-C  predniSONE (DELTASONE) 20 MG tablet Take 2 tablets (40 mg total) by mouth daily. Patient not taking: Reported on 03/04/2017 12/16/14   Muthersbaugh, Dahlia Client, PA-C  triamcinolone ointment (KENALOG) 0.5 % Apply 1 application topically 2 (two) times daily. Treat for 5-7 days then stop for 7 days Patient not taking: Reported on 03/04/2017 02/09/15   Ozella Rocks,  MD    Family History History reviewed. No pertinent family history.  Social History Social History   Tobacco Use  . Smoking status: Current Every Day Smoker    Packs/day: 1.00    Types: Cigarettes  . Smokeless tobacco: Never Used  Substance Use Topics  . Alcohol use: No  . Drug use: No     Allergies   Asa buff (mag [buffered aspirin]; Peanut butter flavor; and Codeine   Review of Systems Review of Systems  Constitutional: Negative for fatigue and fever.  Eyes: Negative for visual disturbance.  Respiratory: Negative for shortness of breath.   Cardiovascular: Negative for chest pain.  Gastrointestinal: Negative for abdominal pain, nausea and vomiting.  Musculoskeletal: Negative for arthralgias and joint swelling.  Skin: Negative for color change, rash and wound.  Neurological: Negative for dizziness, weakness, light-headedness and headaches.     Physical Exam Triage Vital Signs ED Triage Vitals  Enc Vitals Group     BP 11/08/17 1622 132/79     Pulse Rate 11/08/17 1622 86     Resp 11/08/17 1622 18     Temp 11/08/17 1622 98.2 F (36.8 C)     Temp src --      SpO2 11/08/17 1622 97 %     Weight --      Height --      Head Circumference --  Peak Flow --      Pain Score 11/08/17 1620 0     Pain Loc --      Pain Edu? --      Excl. in GC? --    No data found.  Updated Vital Signs BP 132/79   Pulse 86   Temp 98.2 F (36.8 C)   Resp 18   SpO2 97%   Visual Acuity Right Eye Distance:   Left Eye Distance:   Bilateral Distance:    Right Eye Near:   Left Eye Near:    Bilateral Near:     Physical Exam  Constitutional: She appears well-developed and well-nourished. No distress.  HENT:  Head: Normocephalic and atraumatic.  1 dark area to base of hair concerning for nit versus bug, was removed, did appear to be crawling  Eyes: Conjunctivae are normal.  Neck: Neck supple.  Cardiovascular: Normal rate.  No murmur heard. Pulmonary/Chest: Effort normal.  No respiratory distress.  Musculoskeletal: She exhibits no edema.  Neurological: She is alert.  Skin: Skin is warm and dry.  Psychiatric: She has a normal mood and affect.  Nursing note and vitals reviewed.    UC Treatments / Results  Labs (all labs ordered are listed, but only abnormal results are displayed) Labs Reviewed - No data to display  EKG None  Radiology No results found.  Procedures Procedures (including critical care time)  Medications Ordered in UC Medications - No data to display  Initial Impression / Assessment and Plan / UC Course  I have reviewed the triage vital signs and the nursing notes.  Pertinent labs & imaging results that were available during my care of the patient were reviewed by me and considered in my medical decision making (see chart for details).     Patient has positive exposure to head lice as well as concerning area for lice.  We will go ahead and treat with permethrin 5% and repeat in 10 days.Discussed strict return precautions. Patient verbalized understanding and is agreeable with plan.  Final Clinical Impressions(s) / UC Diagnoses   Final diagnoses:  Head lice infestation     Discharge Instructions     Please apply permethrin to hair, leave for 10 minutes, rinse with warm water over sink  Repeat in 9 days  Return if not getting better   ED Prescriptions    Medication Sig Dispense Auth. Provider   permethrin (ELIMITE) 5 % cream Apply to affected area once 120 g Latrail Pounders, LlanoHallie C, PA-C     Controlled Substance Prescriptions  Controlled Substance Registry consulted? Not Applicable   Lew DawesWieters, Cozy Veale C, [place]PA-C 11/08/17 1648

## 2017-12-01 ENCOUNTER — Encounter (HOSPITAL_COMMUNITY): Payer: Self-pay | Admitting: Emergency Medicine

## 2017-12-01 ENCOUNTER — Ambulatory Visit (HOSPITAL_COMMUNITY)
Admission: EM | Admit: 2017-12-01 | Discharge: 2017-12-01 | Disposition: A | Discharge status: Home / Self Care | Payer: Medicaid Other | Attending: Family Medicine | Admitting: Family Medicine

## 2017-12-01 ENCOUNTER — Other Ambulatory Visit: Payer: Self-pay

## 2017-12-01 DIAGNOSIS — R1031 Right lower quadrant pain: Secondary | ICD-10-CM | POA: Diagnosis not present

## 2017-12-01 LAB — POCT URINALYSIS DIP (DEVICE)
Bilirubin Urine: NEGATIVE
Glucose, UA: NEGATIVE mg/dL
HGB URINE DIPSTICK: NEGATIVE
Ketones, ur: NEGATIVE mg/dL
LEUKOCYTES UA: NEGATIVE
NITRITE: NEGATIVE
Protein, ur: NEGATIVE mg/dL
Specific Gravity, Urine: >=1.03 (ref 1.005–1.030)
UROBILINOGEN UA: 0.2 mg/dL (ref 0.0–1.0)
pH: 5 (ref 5.0–8.0)

## 2017-12-01 LAB — POCT PREGNANCY, URINE: PREG TEST UR: NEGATIVE

## 2017-12-01 NOTE — ED Triage Notes (Addendum)
Right side abdominal pain for a week.  Intermittent nausea, no vomiting.  Pain radiates into right leg and flank area.  Denies pain with urination, last normal bm was today

## 2017-12-01 NOTE — Discharge Instructions (Addendum)
Urine did not show signs of infection Urine pregnancy was negative Based on the location of your symptoms I recommend further evaluation and management at the ED Patient aware and in agreement with this plan.

## 2017-12-01 NOTE — ED Provider Notes (Signed)
[facility]   [id number] 12/01/17 Arrival Time: 1529  SUBJECTIVE:  Karla Carey is a 42 y.o. female who presents with complaint of abdominal discomfort that began gradually 1 week ago.  Denies a precipitating event, or trauma. Denies travel, recent antibiotic use, or close contacts with similar symptoms.  Localizes pain to RLQ.  Describes as worsening, intermittent and sharp in character.  Has not tried OTC medications.  Denies alleviating or aggravating factors.  Denies similar symptoms in the past.  Last BM this AM and normal for patient.  Complains of nausea, and left sided flank pain.    Denies fever, chills, appetite changes, weight changes, vomiting, chest pain, SOB, diarrhea, constipation, hematochezia, melena, dysuria, difficulty urinating, increased frequency or urgency, loss of bowel or bladder function.  Hx significant for cholecystectomy, tubal ligation, and four cesarean sections.    Patient's last menstrual period was 10/10/2017.  ROS: As per HPI.  Past Medical History:  Diagnosis Date  . Asthma   . Depression    Past Surgical History:  Procedure Laterality Date  . CESAREAN SECTION    . CHOLECYSTECTOMY    . TUBAL LIGATION     Allergies  Allergen Reactions  . Asa Buff (Mag [Buffered Aspirin] Anaphylaxis  . Peanut Butter Flavor Anaphylaxis  . Codeine Other (See Comments)    Bad headache   No current facility-administered medications on file prior to encounter.    Current Outpatient Medications on File Prior to Encounter  Medication Sig Dispense Refill  . acetaminophen (TYLENOL) 325 MG tablet Take 650 mg by mouth every 6 (six) hours as needed for mild pain.    . benzonatate (TESSALON) 100 MG capsule Take 1 capsule (100 mg total) by mouth every 8 (eight) hours. (Patient not taking: Reported on 03/04/2017) 21 capsule 0  . HYDROcodone-acetaminophen (NORCO/VICODIN) 5-325 MG tablet Take 1-2 tablets by mouth every 6 (six) hours as needed for severe pain. 12  tablet 0  . permethrin (ELIMITE) 5 % cream Apply to affected area once 120 g 0  . predniSONE (DELTASONE) 20 MG tablet Take 2 tablets (40 mg total) by mouth daily. (Patient not taking: Reported on 03/04/2017) 10 tablet 0  . triamcinolone ointment (KENALOG) 0.5 % Apply 1 application topically 2 (two) times daily. Treat for 5-7 days then stop for 7 days (Patient not taking: Reported on 03/04/2017) 30 g 0   Social History   Socioeconomic History  . Marital status: Legally Separated    Spouse name: Not on file  . Number of children: Not on file  . Years of education: Not on file  . Highest education level: Not on file  Occupational History  . Not on file  Social Needs  . Financial resource strain: Not on file  . Food insecurity:    Worry: Not on file    Inability: Not on file  . Transportation needs:    Medical: Not on file    Non-medical: Not on file  Tobacco Use  . Smoking status: Current Every Day Smoker    Packs/day: 1.00    Types: Cigarettes  . Smokeless tobacco: Never Used  Substance and Sexual Activity  . Alcohol use: No  . Drug use: No  . Sexual activity: Yes    Birth control/protection: Surgical  Lifestyle  . Physical activity:    Days per week: Not on file    Minutes per session: Not on file  . Stress: Not on file  Relationships  . Social connections:  Talks on phone: Not on file    Gets together: Not on file    Attends religious service: Not on file    Active member of club or organization: Not on file    Attends meetings of clubs or organizations: Not on file    Relationship status: Not on file  . Intimate partner violence:    Fear of current or ex partner: Not on file    Emotionally abused: Not on file    Physically abused: Not on file    Forced sexual activity: Not on file  Other Topics Concern  . Not on file  Social History Narrative  . Not on file   History reviewed. No pertinent family history.   OBJECTIVE:  Vitals:   12/01/17 1554  BP:  138/66  Pulse: 78  Resp: 18  Temp: 98 F (36.7 C)  TempSrc: Oral  SpO2: 98%    General appearance: AOx3 in no acute distress; sitting comfortably in chair  HEENT: NCAT.  Oropharynx clear.  Poor dentition Lungs: clear to auscultation bilaterally without adventitious breath sounds Heart: regular rate and rhythm.  Radial pulses 2+ symmetrical bilaterally Abdomen: soft, protuberant, non-distended; normal active bowel sounds; tender about the RLQ; tender at McBurney's point; negative Murphy's sign; negative rebound; minimal guarding Back: no CVA tenderness Extremities: no edema; symmetrical with no gross deformities Skin: warm and dry Neurologic: normal gait Psychological: alert and cooperative; normal mood and affect  Labs: Results for orders placed or performed during the hospital encounter of 12/01/17 (from the past 24 hour(s))  POCT urinalysis dip (device)     Status: None   Collection Time: 12/01/17  4:26 PM  Result Value Ref Range   Glucose, UA NEGATIVE NEGATIVE mg/dL   Bilirubin Urine NEGATIVE NEGATIVE   Ketones, ur NEGATIVE NEGATIVE mg/dL   Specific Gravity, Urine >=1.030 1.005 - 1.030   Hgb urine dipstick NEGATIVE NEGATIVE   pH 5.0 5.0 - 8.0   Protein, ur NEGATIVE NEGATIVE mg/dL   Urobilinogen, UA 0.2 0.0 - 1.0 mg/dL   Nitrite NEGATIVE NEGATIVE   Leukocytes, UA NEGATIVE NEGATIVE  Pregnancy, urine POC     Status: None   Collection Time: 12/01/17  4:28 PM  Result Value Ref Range   Preg Test, Ur NEGATIVE NEGATIVE    ASSESSMENT & PLAN:  1. Right lower quadrant pain     No orders of the defined types were placed in this encounter.  Urine did not show signs of infection Urine pregnancy was negative Based on the location of your symptoms I recommend further evaluation and management at the ED Patient aware and in agreement with this plan.    Reviewed expectations re: course of current medical issues. Questions answered. Outlined signs and symptoms indicating need for  more acute intervention. Patient verbalized understanding. After Visit Summary given.   Rennis Harding, PA-C 12/01/17 1656

## 2017-12-02 ENCOUNTER — Emergency Department (HOSPITAL_COMMUNITY)
Admission: EM | Admit: 2017-12-02 | Discharge: 2017-12-02 | Disposition: A | Discharge status: Home / Self Care | Payer: Medicaid Other | Attending: Emergency Medicine | Admitting: Emergency Medicine

## 2017-12-02 ENCOUNTER — Emergency Department (HOSPITAL_COMMUNITY): Payer: Medicaid Other

## 2017-12-02 ENCOUNTER — Encounter (HOSPITAL_COMMUNITY): Payer: Self-pay | Admitting: Emergency Medicine

## 2017-12-02 DIAGNOSIS — J45909 Unspecified asthma, uncomplicated: Secondary | ICD-10-CM | POA: Insufficient documentation

## 2017-12-02 DIAGNOSIS — R1031 Right lower quadrant pain: Secondary | ICD-10-CM | POA: Diagnosis not present

## 2017-12-02 DIAGNOSIS — Z91010 Allergy to peanuts: Secondary | ICD-10-CM | POA: Diagnosis not present

## 2017-12-02 DIAGNOSIS — F17210 Nicotine dependence, cigarettes, uncomplicated: Secondary | ICD-10-CM | POA: Diagnosis not present

## 2017-12-02 LAB — URINALYSIS, ROUTINE W REFLEX MICROSCOPIC
Bilirubin Urine: NEGATIVE
GLUCOSE, UA: NEGATIVE mg/dL
HGB URINE DIPSTICK: NEGATIVE
Ketones, ur: NEGATIVE mg/dL
Leukocytes, UA: NEGATIVE
Nitrite: NEGATIVE
PH: 6 (ref 5.0–8.0)
PROTEIN: NEGATIVE mg/dL
Specific Gravity, Urine: 1.026 (ref 1.005–1.030)

## 2017-12-02 LAB — COMPREHENSIVE METABOLIC PANEL
ALT: 14 U/L (ref 0–44)
AST: 15 U/L (ref 15–41)
Albumin: 3.5 g/dL (ref 3.5–5.0)
Alkaline Phosphatase: 64 U/L (ref 38–126)
Anion gap: 7 (ref 5–15)
BUN: 15 mg/dL (ref 6–20)
CHLORIDE: 110 mmol/L (ref 98–111)
CO2: 23 mmol/L (ref 22–32)
Calcium: 10 mg/dL (ref 8.9–10.3)
Creatinine, Ser: 0.93 mg/dL (ref 0.44–1.00)
Glucose, Bld: 95 mg/dL (ref 70–99)
POTASSIUM: 4.1 mmol/L (ref 3.5–5.1)
SODIUM: 140 mmol/L (ref 135–145)
Total Bilirubin: 0.5 mg/dL (ref 0.3–1.2)
Total Protein: 6.7 g/dL (ref 6.5–8.1)

## 2017-12-02 LAB — LIPASE, BLOOD: LIPASE: 39 U/L (ref 11–51)

## 2017-12-02 LAB — CBC
HCT: 34 % — ABNORMAL LOW (ref 36.0–46.0)
Hemoglobin: 9.5 g/dL — ABNORMAL LOW (ref 12.0–15.0)
MCH: 20.2 pg — AB (ref 26.0–34.0)
MCHC: 27.9 g/dL — AB (ref 30.0–36.0)
MCV: 72.2 fL — ABNORMAL LOW (ref 78.0–100.0)
Platelets: 284 10*3/uL (ref 150–400)
RBC: 4.71 MIL/uL (ref 3.87–5.11)
RDW: 19.1 % — AB (ref 11.5–15.5)
WBC: 8.8 10*3/uL (ref 4.0–10.5)

## 2017-12-02 LAB — I-STAT BETA HCG BLOOD, ED (MC, WL, AP ONLY): I-stat hCG, quantitative: <5 m[IU]/mL (ref <5)

## 2017-12-02 MED ORDER — ONDANSETRON 4 MG PO TBDP: 4.0000 mg | ORAL_TABLET | Freq: Three times a day (TID) | ORAL | 0 refills | Status: DC | PRN

## 2017-12-02 MED ORDER — IOHEXOL 300 MG/ML  SOLN
100.0000 mL | Freq: Once | INTRAMUSCULAR | Status: AC | PRN
  Administered 2017-12-02: 100 mL via INTRAVENOUS

## 2017-12-02 MED ORDER — MORPHINE SULFATE (PF) 4 MG/ML IV SOLN
4.0000 mg | Freq: Once | INTRAVENOUS | Status: AC
  Administered 2017-12-02: 4 mg via INTRAVENOUS
  Filled 2017-12-02: qty 1

## 2017-12-02 MED ORDER — TRAMADOL HCL 50 MG PO TABS: 50.0000 mg | ORAL_TABLET | Freq: Four times a day (QID) | ORAL | 0 refills | Status: DC | PRN

## 2017-12-02 MED ORDER — ONDANSETRON HCL 4 MG/2ML IJ SOLN
4.0000 mg | Freq: Once | INTRAMUSCULAR | Status: AC
  Administered 2017-12-02: 4 mg via INTRAVENOUS
  Filled 2017-12-02: qty 2

## 2017-12-02 NOTE — ED Triage Notes (Signed)
Pt reports RLQ abd pain X few days with N/V. [facility] and was told to come here for further eval if pain didn't get any better.

## 2017-12-02 NOTE — Discharge Instructions (Signed)
Take the prescribed medication as directed. °Follow-up with women's clinic-- call for appt. °Return to the ED for new or worsening symptoms. °

## 2017-12-02 NOTE — ED Provider Notes (Signed)
[facility] Provider Note   CSN: [id number] Arrival date & time: 12/02/17  0015     History   Chief Complaint Chief Complaint  Patient presents with  . Abdominal Pain    HPI Genene ChurnWendy R Hutsell is a 42 y.o. female.  The history is provided by the patient and medical records.  Abdominal Pain   Associated symptoms include nausea.    42 year old female with history of asthma, depression, presenting to the ED for right lower abdominal pain.  Reports this began about 3 days ago and has been steadily worsening since that time.  Patient states pain does not seem to radiate but she has associated nausea and decreased appetite.  She does report some chills but denies fever.  No urinary symptoms, pelvic pain, irregular vaginal bleeding, or vaginal discharge.  States her menstrual is actually late this month.  She is s/p tubal ligation, c-section, and cholecystectomy.  Seen at urgent care earlier today and told to come here for further evaluation.  Past Medical History:  Diagnosis Date  . Asthma   . Depression     There are no active problems to display for this patient.   Past Surgical History:  Procedure Laterality Date  . CESAREAN SECTION    . CHOLECYSTECTOMY    . TUBAL LIGATION       OB History   None      Home Medications    Prior to Admission medications   Medication Sig Start Date End Date Taking? Authorizing Provider  acetaminophen (TYLENOL) 325 MG tablet Take 650 mg by mouth every 6 (six) hours as needed for mild pain.    [provider]  benzonatate (TESSALON) 100 MG capsule Take 1 capsule (100 mg total) by mouth every 8 (eight) hours. Patient not taking: Reported on 03/04/2017 12/16/14   Muthersbaugh, Dahlia ClientHannah, PA-C  HYDROcodone-acetaminophen (NORCO/VICODIN) 5-325 MG tablet Take 1-2 tablets by mouth every 6 (six) hours as needed for severe pain. 03/04/17   Shaune PollackIsaacs, Cameron, MD  permethrin (ELIMITE) 5 % cream Apply to affected  area once 11/08/17   Wieters, Hallie C, PA-C  predniSONE (DELTASONE) 20 MG tablet Take 2 tablets (40 mg total) by mouth daily. Patient not taking: Reported on 03/04/2017 12/16/14   Muthersbaugh, Dahlia ClientHannah, PA-C  triamcinolone ointment (KENALOG) 0.5 % Apply 1 application topically 2 (two) times daily. Treat for 5-7 days then stop for 7 days Patient not taking: Reported on 03/04/2017 02/09/15   Ozella RocksMerrell, David J, MD    Family History No family history on file.  Social History Social History   Tobacco Use  . Smoking status: Current Every Day Smoker    Packs/day: 1.00    Types: Cigarettes  . Smokeless tobacco: Never Used  Substance Use Topics  . Alcohol use: No  . Drug use: No     Allergies   Asa buff (mag [buffered aspirin]; Peanut butter flavor; and Codeine   Review of Systems Review of Systems  Constitutional: Positive for appetite change.  Gastrointestinal: Positive for abdominal pain and nausea.  All other systems reviewed and are negative.    Physical Exam Updated Vital Signs BP 131/87 (BP Location: Right Arm)   Pulse 77   Temp 98 F (36.7 C) (Oral)   Resp (!) 22   Ht 4\' 11"  (1.499 m)   Wt 93 kg (205 lb)   SpO2 98%   BMI 41.40 kg/m   Physical Exam  Constitutional: She is oriented to person, place, and time.  She appears well-developed and well-nourished.  HENT:  Head: Normocephalic and atraumatic.  Mouth/Throat: Oropharynx is clear and moist.  Eyes: Pupils are equal, round, and reactive to light. Conjunctivae and EOM are normal.  Neck: Normal range of motion.  Cardiovascular: Normal rate, regular rhythm and normal heart sounds.  Pulmonary/Chest: Effort normal and breath sounds normal.  Abdominal: Soft. Bowel sounds are normal. There is tenderness in the right lower quadrant. There is tenderness at McBurney's point.  Musculoskeletal: Normal range of motion.  Neurological: She is alert and oriented to person, place, and time.  Skin: Skin is warm and dry.    Psychiatric: She has a normal mood and affect.  Nursing note and vitals reviewed.    ED Treatments / Results  Labs (all labs ordered are listed, but only abnormal results are displayed) Labs Reviewed  CBC - Abnormal; Notable for the following components:      Result Value   Hemoglobin 9.5 (*)    HCT 34.0 (*)    MCV 72.2 (*)    MCH 20.2 (*)    MCHC 27.9 (*)    RDW 19.1 (*)    All other components within normal limits  LIPASE, BLOOD  COMPREHENSIVE METABOLIC PANEL  URINALYSIS, ROUTINE W REFLEX MICROSCOPIC  I-STAT BETA HCG BLOOD, ED ([facility], [facility], [facility] ONLY)    EKG None  Radiology Ct Abdomen Pelvis W Contrast  Result Date: 12/02/2017 CLINICAL DATA:  Right lower quadrant pain.  Nausea and vomiting. EXAM: CT ABDOMEN AND PELVIS WITH CONTRAST TECHNIQUE: Multidetector CT imaging of the abdomen and pelvis was performed using the standard protocol following bolus administration of intravenous contrast. CONTRAST:  OMNIPAQUE IOHEXOL 300 MG/ML  SOLN COMPARISON:  CT 12/05/2012, 03/27/2011 FINDINGS: Lower chest: Heterogeneous attenuation of the lung bases similar to chest CT 03/04/2017, suggesting underlying air trapping. No consolidation or pleural fluid. Normal heart size. Hepatobiliary: No focal liver abnormality is seen. Status post cholecystectomy. No biliary dilatation. Pancreas: No ductal dilatation or inflammation. Spleen: Mildly enlarged spanning 15 cm cranial caudal. Adrenals/Urinary Tract: No adrenal nodule. No hydronephrosis or perinephric edema. Homogeneous renal enhancement with symmetric excretion on delayed phase imaging. Urinary bladder is partially distended without wall thickening. Stomach/Bowel: Appendix not well visualized, no pericecal or right lower quadrant inflammation to suggest appendicitis. Appendix was also not well visualized on prior exams. No bowel inflammation, inflammatory change or obstruction. No bowel wall thickening. Vascular/Lymphatic: No significant vascular  findings are present. No enlarged abdominal or pelvic lymph nodes. Reproductive: The uterus is displaced anteriorly as seen previously, questionable fibroids posteriorly. No adnexal mass. Other: No free air, free fluid, or intra-abdominal fluid collection. Musculoskeletal: There are no acute or suspicious osseous abnormalities. IMPRESSION: 1. No acute findings or explanation for abdominal pain. No evidence of appendicitis. 2. Mild splenomegaly. 3. Possible uterine fibroids. Electronically Signed   By: Rubye Oaks M.D.   On: 12/02/2017 05:39    Procedures Procedures (including critical care time)  Medications Ordered in ED Medications  morphine 4 MG/ML injection 4 mg (4 mg Intravenous Given 12/02/17 0247)  ondansetron (ZOFRAN) injection 4 mg (4 mg Intravenous Given 12/02/17 0247)  iohexol (OMNIPAQUE) 300 MG/ML solution 100 mL (100 mLs Intravenous Contrast Given 12/02/17 0432)     Initial Impression / Assessment and Plan / ED Course  I have reviewed the triage vital signs and the nursing notes.  Pertinent labs & imaging results that were available during my care of the patient were reviewed by me and considered in my medical decision  making (see chart for details).  42 y.o. F here with RLQ abdominal pain.  Worsening over the past 3 days with associated nausea and decreased PO intake.  She is afebrile, non-toxic.  TTP in RLQ at McBurney's point.  Labs overall reassuring.  Clinical concern for appendicitis.  Medications and CT ordered.     CT negative for acute findings.  Patient sleeping on repeat assessment, states she is feeling better.  She is requesting to eat/drink at this time.  She discussed with me that she is unsure why she did not get her period.  She is s/p tubal ligation, pregnancy test here negative.  No current pelvic pain or abnormal findings in the adnexa on CT, questionable fibroids.  She is age 65, states mother and grandmother went through menopause very early.  This is certainly  a possibility.  Does not have GYN, will refer to wellness clinic for follow-up.  Discussed plan with patient, she acknowledged understanding and agreed with plan of care.  Return precautions given for new or worsening symptoms.  Final Clinical Impressions(s) / ED Diagnoses   Final diagnoses:  Right lower quadrant abdominal pain    ED Discharge Orders        Ordered    ondansetron (ZOFRAN ODT) 4 MG disintegrating tablet  Every 8 hours PRN     12/02/17 0601    traMADol (ULTRAM) 50 MG tablet  Every 6 hours PRN     12/02/17 0601       Garlon Hatchet, PA-C 12/02/17 0615    Palumbo, April, MD 12/02/17 1610

## 2018-05-02 ENCOUNTER — Ambulatory Visit (HOSPITAL_COMMUNITY)
Admission: EM | Admit: 2018-05-02 | Discharge: 2018-05-02 | Disposition: A | Discharge status: Home / Self Care | Payer: Medicaid Other | Attending: Family Medicine | Admitting: Family Medicine

## 2018-05-02 ENCOUNTER — Encounter (HOSPITAL_COMMUNITY): Payer: Self-pay | Admitting: Family Medicine

## 2018-05-02 DIAGNOSIS — K0889 Other specified disorders of teeth and supporting structures: Secondary | ICD-10-CM

## 2018-05-02 DIAGNOSIS — M25511 Pain in right shoulder: Secondary | ICD-10-CM | POA: Diagnosis not present

## 2018-05-02 DIAGNOSIS — R590 Localized enlarged lymph nodes: Secondary | ICD-10-CM | POA: Diagnosis not present

## 2018-05-02 MED ORDER — PENICILLIN V POTASSIUM 500 MG PO TABS: 500.0000 mg | ORAL_TABLET | Freq: Three times a day (TID) | ORAL | 0 refills | Status: DC

## 2018-05-02 MED ORDER — TRAMADOL HCL 50 MG PO TABS: 50.0000 mg | ORAL_TABLET | Freq: Four times a day (QID) | ORAL | 0 refills | Status: DC | PRN

## 2018-05-02 NOTE — Discharge Instructions (Addendum)
The neck swelling and fatigue are caused by a dental abscess coming from tooth #30.  The medicine prescribed will help you temporarily but you will need to have a dentist or oral surgeon take care of tooth #30 to prevent the problem from coming back.  I suspect that the right shoulder may be related to some muscle tightness in your right neck, but it is also possible that you have some strain from setting of the Christmas tree last weekend.  In either case, I think the shoulder will gradually get better with time and the current plan.

## 2018-05-02 NOTE — ED Triage Notes (Signed)
Pt presents with shoulder and neck pain that radiates up behind ear and makes her head hurt. Pt states she has pain with movement in any direction.

## 2018-05-02 NOTE — ED Provider Notes (Signed)
[facility]    CSN: [id number] Arrival date & time: 05/02/18  [id number]     History   Chief Complaint Chief Complaint  Patient presents with  . Neck Pain  . Shoulder Pain    HPI Karla Carey is a 42 y.o. female.   This a 42 year old established urgent care patient who presents with neck swelling and fatigue.  She recently went to the dentist who did 8 fillings and told her she needed to have 2 teeth pulled by an oral surgeon.  In addition, she set up a Christmas tree last weekend and feels like she might of strained her shoulder on the right side.  She had no fever.  She is a smoker.  Her problems are about 4 days in duration with fatigue, right neck swelling, and tenderness in the right shoulder, right neck.  She says the pain radiates from her neck up to the side of her right face and ear as well as radiating toward her shoulder.     Note from July of this year: Karla Carey is a 42 y.o. female who presents with complaint of abdominal discomfort that began gradually 1 week ago.  Denies a precipitating event, or trauma. Denies travel, recent antibiotic use, or close contacts with similar symptoms.  Localizes pain to RLQ.  Describes as worsening, intermittent and sharp in character.  Has not tried OTC medications.  Denies alleviating or aggravating factors.  Denies similar symptoms in the past.  Last BM this AM and normal for patient.  Complains of nausea, and left sided flank pain.    Denies fever, chills, appetite changes, weight changes, vomiting, chest pain, SOB, diarrhea, constipation, hematochezia, melena, dysuria, difficulty urinating, increased frequency or urgency, loss of bowel or bladder function.  Hx significant for cholecystectomy, tubal ligation, and four cesarean sections.       Past Medical History:  Diagnosis Date  . Asthma   . Depression     There are no active problems to display for this patient.   Past Surgical History:  Procedure Laterality  Date  . CESAREAN SECTION    . CHOLECYSTECTOMY    . TUBAL LIGATION      OB History   None      Home Medications    Prior to Admission medications   Medication Sig Start Date End Date Taking? Authorizing Provider  acetaminophen (TYLENOL) 325 MG tablet Take 650 mg by mouth every 6 (six) hours as needed for mild pain.    [provider]  albuterol (PROVENTIL HFA;VENTOLIN HFA) 108 (90 Base) MCG/ACT inhaler Inhale 1-2 puffs into the lungs every 6 (six) hours as needed for wheezing or shortness of breath.    [provider]  penicillin v potassium (VEETID) 500 MG tablet Take 1 tablet (500 mg total) by mouth 3 (three) times daily. 05/02/18   Elvina SidleLauenstein, Tirso Laws, MD  traMADol (ULTRAM) 50 MG tablet Take 1 tablet (50 mg total) by mouth every 6 (six) hours as needed. 05/02/18   Elvina SidleLauenstein, Shantale Holtmeyer, MD    Family History History reviewed. No pertinent family history.  Social History Social History   Tobacco Use  . Smoking status: Current Every Day Smoker    Packs/day: 1.00    Types: Cigarettes  . Smokeless tobacco: Never Used  Substance Use Topics  . Alcohol use: No  . Drug use: No     Allergies   Asa buff (mag [buffered aspirin]; Peanut butter flavor; and Codeine   Review of Systems  Review of Systems  Constitutional: Positive for appetite change and fatigue. Negative for fever.  HENT: Positive for dental problem and ear pain.      Physical Exam Triage Vital Signs ED Triage Vitals  Enc Vitals Group     BP      Pulse      Resp      Temp      Temp src      SpO2      Weight      Height      Head Circumference      Peak Flow      Pain Score      Pain Loc      Pain Edu?      Excl. in GC?    No data found.  Updated Vital Signs BP 128/86 (BP Location: Left Arm)   Pulse 70   Temp 97.9 F (36.6 C) (Oral)   Resp 20   LMP 03/24/2018   SpO2 97%    Physical Exam  Constitutional: She appears well-developed and well-nourished.  HENT:  Right Ear:  External ear normal.  Left Ear: External ear normal.  Mouth/Throat: Oropharynx is clear and moist.  Patient has a cavity in tooth #30  Eyes: Pupils are equal, round, and reactive to light. Conjunctivae are normal.  Neck: Normal range of motion. Neck supple.  Patient has swollen lymph nodes at the angle of the jaw and her right side of her neck is generally tender in the muscles are tight.  Pulmonary/Chest: Effort normal.  Musculoskeletal: Normal range of motion. She exhibits tenderness.  Mild tenderness in the anterior right shoulder joint line  Lymphadenopathy:    She has cervical adenopathy.  Skin: Skin is warm and dry.  Psychiatric: She has a normal mood and affect.  Nursing note and vitals reviewed.    UC Treatments / Results  Labs (all labs ordered are listed, but only abnormal results are displayed) Labs Reviewed - No data to display  EKG None  Radiology No results found.  Procedures Procedures (including critical care time)  Medications Ordered in UC Medications - No data to display  Initial Impression / Assessment and Plan / UC Course  I have reviewed the triage vital signs and the nursing notes.  Pertinent labs & imaging results that were available during my care of the patient were reviewed by me and considered in my medical decision making (see chart for details).    Final Clinical Impressions(s) / UC Diagnoses   Final diagnoses:  Pain, dental  Anterior cervical adenopathy  Acute pain of right shoulder     Discharge Instructions     The neck swelling and fatigue are caused by a dental abscess coming from tooth #30.  The medicine prescribed will help you temporarily but you will need to have a dentist or oral surgeon take care of tooth #30 to prevent the problem from coming back.  I suspect that the right shoulder may be related to some muscle tightness in your right neck, but it is also possible that you have some strain from setting of the Christmas  tree last weekend.  In either case, I think the shoulder will gradually get better with time and the current plan.    ED Prescriptions    Medication Sig Dispense Auth. Provider   traMADol (ULTRAM) 50 MG tablet Take 1 tablet (50 mg total) by mouth every 6 (six) hours as needed. 15 tablet Elvina Sidle, MD   penicillin v  potassium (VEETID) 500 MG tablet Take 1 tablet (500 mg total) by mouth 3 (three) times daily. 30 tablet Elvina Sidle, MD     Controlled Substance Prescriptions [place] Controlled Substance Registry consulted? Not Applicable   Elvina Sidle, MD 05/02/18 [phone]

## 2018-06-13 ENCOUNTER — Emergency Department (HOSPITAL_COMMUNITY): Payer: Medicaid Other

## 2018-06-13 ENCOUNTER — Inpatient Hospital Stay (HOSPITAL_COMMUNITY)
Admission: EM | Admit: 2018-06-13 | Discharge: 2018-06-15 | DRG: 194 | Disposition: A | Discharge status: Home / Self Care | Payer: Medicaid Other | Attending: Internal Medicine | Admitting: Internal Medicine

## 2018-06-13 DIAGNOSIS — R0902 Hypoxemia: Secondary | ICD-10-CM | POA: Diagnosis present

## 2018-06-13 DIAGNOSIS — Z91010 Allergy to peanuts: Secondary | ICD-10-CM | POA: Diagnosis not present

## 2018-06-13 DIAGNOSIS — Z886 Allergy status to analgesic agent status: Secondary | ICD-10-CM | POA: Diagnosis not present

## 2018-06-13 DIAGNOSIS — N920 Excessive and frequent menstruation with regular cycle: Secondary | ICD-10-CM | POA: Diagnosis present

## 2018-06-13 DIAGNOSIS — F17200 Nicotine dependence, unspecified, uncomplicated: Secondary | ICD-10-CM | POA: Diagnosis present

## 2018-06-13 DIAGNOSIS — Z791 Long term (current) use of non-steroidal anti-inflammatories (NSAID): Secondary | ICD-10-CM

## 2018-06-13 DIAGNOSIS — F329 Major depressive disorder, single episode, unspecified: Secondary | ICD-10-CM | POA: Diagnosis present

## 2018-06-13 DIAGNOSIS — Z79891 Long term (current) use of opiate analgesic: Secondary | ICD-10-CM | POA: Diagnosis not present

## 2018-06-13 DIAGNOSIS — J440 Chronic obstructive pulmonary disease with acute lower respiratory infection: Secondary | ICD-10-CM | POA: Diagnosis present

## 2018-06-13 DIAGNOSIS — J181 Lobar pneumonia, unspecified organism: Secondary | ICD-10-CM

## 2018-06-13 DIAGNOSIS — Z79899 Other long term (current) drug therapy: Secondary | ICD-10-CM

## 2018-06-13 DIAGNOSIS — Z720 Tobacco use: Secondary | ICD-10-CM | POA: Diagnosis present

## 2018-06-13 DIAGNOSIS — Z6841 Body Mass Index (BMI) 40.0 and over, adult: Secondary | ICD-10-CM

## 2018-06-13 DIAGNOSIS — F32A Depression, unspecified: Secondary | ICD-10-CM | POA: Diagnosis present

## 2018-06-13 DIAGNOSIS — J441 Chronic obstructive pulmonary disease with (acute) exacerbation: Secondary | ICD-10-CM | POA: Diagnosis present

## 2018-06-13 DIAGNOSIS — J45901 Unspecified asthma with (acute) exacerbation: Secondary | ICD-10-CM | POA: Diagnosis present

## 2018-06-13 DIAGNOSIS — J189 Pneumonia, unspecified organism: Secondary | ICD-10-CM | POA: Diagnosis not present

## 2018-06-13 LAB — CBC
HCT: 38.9 % (ref 36.0–46.0)
HEMOGLOBIN: 11.9 g/dL — AB (ref 12.0–15.0)
MCH: 26.1 pg (ref 26.0–34.0)
MCHC: 30.6 g/dL (ref 30.0–36.0)
MCV: 85.3 fL (ref 80.0–100.0)
PLATELETS: 229 10*3/uL (ref 150–400)
RBC: 4.56 MIL/uL (ref 3.87–5.11)
RDW: 17.4 % — ABNORMAL HIGH (ref 11.5–15.5)
WBC: 5.7 10*3/uL (ref 4.0–10.5)
nRBC: 0 % (ref 0.0–0.2)

## 2018-06-13 LAB — COMPREHENSIVE METABOLIC PANEL
ALK PHOS: 47 U/L (ref 38–126)
ALT: 18 U/L (ref 0–44)
ANION GAP: 9 (ref 5–15)
AST: 18 U/L (ref 15–41)
Albumin: 3.3 g/dL — ABNORMAL LOW (ref 3.5–5.0)
BUN: 9 mg/dL (ref 6–20)
CALCIUM: 9.6 mg/dL (ref 8.9–10.3)
CO2: 22 mmol/L (ref 22–32)
Chloride: 104 mmol/L (ref 98–111)
Creatinine, Ser: 0.74 mg/dL (ref 0.44–1.00)
GFR calc non Af Amer: >60 mL/min (ref >60)
Glucose, Bld: 91 mg/dL (ref 70–99)
Potassium: 4.3 mmol/L (ref 3.5–5.1)
SODIUM: 135 mmol/L (ref 135–145)
Total Bilirubin: 0.4 mg/dL (ref 0.3–1.2)
Total Protein: 7.1 g/dL (ref 6.5–8.1)

## 2018-06-13 LAB — TROPONIN I: Troponin I: <0.03 ng/mL (ref <0.03)

## 2018-06-13 MED ORDER — IPRATROPIUM-ALBUTEROL 0.5-2.5 (3) MG/3ML IN SOLN
3.0000 mL | Freq: Four times a day (QID) | RESPIRATORY_TRACT | Status: DC
  Administered 2018-06-14: 3 mL via RESPIRATORY_TRACT
  Filled 2018-06-13: qty 3

## 2018-06-13 MED ORDER — SODIUM CHLORIDE 0.9 % IV SOLN
500.0000 mg | Freq: Once | INTRAVENOUS | Status: AC
  Administered 2018-06-13: 500 mg via INTRAVENOUS
  Filled 2018-06-13: qty 500

## 2018-06-13 MED ORDER — NICOTINE 21 MG/24HR TD PT24
21.0000 mg | MEDICATED_PATCH | Freq: Every day | TRANSDERMAL | Status: DC
  Filled 2018-06-13 (×2): qty 1

## 2018-06-13 MED ORDER — ENOXAPARIN SODIUM 40 MG/0.4ML ~~LOC~~ SOLN
40.0000 mg | Freq: Every day | SUBCUTANEOUS | Status: DC
  Administered 2018-06-14: 40 mg via SUBCUTANEOUS
  Filled 2018-06-13: qty 0.4

## 2018-06-13 MED ORDER — SODIUM CHLORIDE 0.9 % IV BOLUS
1000.0000 mL | Freq: Once | INTRAVENOUS | Status: AC
  Administered 2018-06-13: 1000 mL via INTRAVENOUS

## 2018-06-13 MED ORDER — ONDANSETRON HCL 4 MG/2ML IJ SOLN
4.0000 mg | Freq: Once | INTRAMUSCULAR | Status: AC
  Administered 2018-06-13: 4 mg via INTRAVENOUS
  Filled 2018-06-13: qty 2

## 2018-06-13 MED ORDER — IPRATROPIUM-ALBUTEROL 0.5-2.5 (3) MG/3ML IN SOLN
3.0000 mL | Freq: Once | RESPIRATORY_TRACT | Status: AC
  Administered 2018-06-13: 3 mL via RESPIRATORY_TRACT
  Filled 2018-06-13: qty 3

## 2018-06-13 MED ORDER — METHYLPREDNISOLONE SODIUM SUCC 125 MG IJ SOLR
125.0000 mg | Freq: Four times a day (QID) | INTRAMUSCULAR | Status: DC
  Administered 2018-06-14 (×2): 125 mg via INTRAVENOUS
  Filled 2018-06-13 (×2): qty 2

## 2018-06-13 MED ORDER — ALBUTEROL SULFATE (2.5 MG/3ML) 0.083% IN NEBU
5.0000 mg | INHALATION_SOLUTION | Freq: Once | RESPIRATORY_TRACT | Status: AC
  Administered 2018-06-13: 5 mg via RESPIRATORY_TRACT
  Filled 2018-06-13: qty 6

## 2018-06-13 MED ORDER — DOXYCYCLINE HYCLATE 100 MG PO TABS
100.0000 mg | ORAL_TABLET | Freq: Two times a day (BID) | ORAL | Status: DC
  Administered 2018-06-14 – 2018-06-15 (×4): 100 mg via ORAL
  Filled 2018-06-13 (×4): qty 1

## 2018-06-13 MED ORDER — PREDNISONE 20 MG PO TABS: 40.0000 mg | ORAL_TABLET | Freq: Every day | ORAL | Status: DC

## 2018-06-13 MED ORDER — SODIUM CHLORIDE 0.9 % IV SOLN
1.0000 g | Freq: Once | INTRAVENOUS | Status: AC
  Administered 2018-06-13: 1 g via INTRAVENOUS
  Filled 2018-06-13: qty 10

## 2018-06-13 NOTE — ED Triage Notes (Signed)
Pt here from home with c/o flu like symptom along wth some n/v/d/ pt was getting better but feels worse today , pt is a smoker

## 2018-06-13 NOTE — ED Provider Notes (Signed)
[facility] EMERGENCY DEPARTMENT Provider Note   CSN: [id number] Arrival date & time: 06/13/18  1638     History   Chief Complaint Chief Complaint  Patient presents with  . Influenza    HPI MELISSE FLECKER is a 43 y.o. female.  Patient is a 43 year old female who presents with cough and shortness of breath.  She states that she had flulike symptoms just after Christmas.  She states that she felt like that is gotten better but then a week ago started having more coughing and shortness of breath.  She is also had some fevers at home.  She has had some associated nausea and vomiting with decreased appetite.  She does not really have any nasal congestion.  She has cough wheezing and shortness of breath with some pressure feeling in the center of her chest.  She does have a history of asthma but states she does not really have much problem with that and does not use inhalers or nebulizers at home.     Past Medical History:  Diagnosis Date  . Asthma   . Depression     There are no active problems to display for this patient.   Past Surgical History:  Procedure Laterality Date  . CESAREAN SECTION    . CHOLECYSTECTOMY    . TUBAL LIGATION       OB History   No obstetric history on file.      Home Medications    Prior to Admission medications   Medication Sig Start Date End Date Taking? Authorizing Provider  acetaminophen (TYLENOL) 325 MG tablet Take 650 mg by mouth every 6 (six) hours as needed for mild pain.    [provider]  albuterol (PROVENTIL HFA;VENTOLIN HFA) 108 (90 Base) MCG/ACT inhaler Inhale 1-2 puffs into the lungs every 6 (six) hours as needed for wheezing or shortness of breath.    [provider]  penicillin v potassium (VEETID) 500 MG tablet Take 1 tablet (500 mg total) by mouth 3 (three) times daily. 05/02/18   Elvina Sidle, MD  traMADol (ULTRAM) 50 MG tablet Take 1 tablet (50 mg total) by mouth every 6 (six) hours as  needed. 05/02/18   Elvina Sidle, MD    Family History No family history on file.  Social History Social History   Tobacco Use  . Smoking status: Current Every Day Smoker    Packs/day: 1.00    Types: Cigarettes  . Smokeless tobacco: Never Used  Substance Use Topics  . Alcohol use: No  . Drug use: No     Allergies   Asa buff (mag [buffered aspirin]; Peanut butter flavor; and Codeine   Review of Systems Review of Systems  Constitutional: Positive for fatigue and fever. Negative for chills and diaphoresis.  HENT: Negative for congestion, rhinorrhea and sneezing.   Eyes: Negative.   Respiratory: Positive for cough, shortness of breath and wheezing. Negative for chest tightness.   Cardiovascular: Negative for chest pain and leg swelling.  Gastrointestinal: Positive for nausea and vomiting. Negative for abdominal pain, blood in stool and diarrhea.  Genitourinary: Negative for difficulty urinating, flank pain, frequency and hematuria.  Musculoskeletal: Negative for arthralgias and back pain.  Skin: Negative for rash.  Neurological: Negative for dizziness, speech difficulty, weakness, numbness and headaches.     Physical Exam Updated Vital Signs BP 103/63   Pulse 79   Temp 99.2 F (37.3 C) (Oral)   Resp 16   LMP 06/13/2018   SpO2 98%  Physical Exam Constitutional:      Appearance: She is well-developed.  HENT:     Head: Normocephalic and atraumatic.     Right Ear: Tympanic membrane normal.     Left Ear: Tympanic membrane normal.     Nose: No congestion.     Mouth/Throat:     Mouth: Mucous membranes are moist.     Pharynx: No oropharyngeal exudate or posterior oropharyngeal erythema.  Eyes:     Pupils: Pupils are equal, round, and reactive to light.  Neck:     Musculoskeletal: Normal range of motion and neck supple.  Cardiovascular:     Rate and Rhythm: Normal rate and regular rhythm.     Heart sounds: Normal heart sounds.  Pulmonary:     Effort:  Pulmonary effort is normal. No respiratory distress.     Breath sounds: Wheezing present. No rales.  Chest:     Chest wall: No tenderness.  Abdominal:     General: Bowel sounds are normal.     Palpations: Abdomen is soft.     Tenderness: There is no abdominal tenderness. There is no guarding or rebound.  Musculoskeletal: Normal range of motion.  Lymphadenopathy:     Cervical: No cervical adenopathy.  Skin:    General: Skin is warm and dry.     Findings: No rash.  Neurological:     Mental Status: She is alert and oriented to person, place, and time.      ED Treatments / Results  Labs (all labs ordered are listed, but only abnormal results are displayed) Labs Reviewed  CBC - Abnormal; Notable for the following components:      Result Value   Hemoglobin 11.9 (*)    RDW 17.4 (*)    All other components within normal limits  COMPREHENSIVE METABOLIC PANEL - Abnormal; Notable for the following components:   Albumin 3.3 (*)    All other components within normal limits  TROPONIN I  INFLUENZA PANEL BY PCR (TYPE A & B)    EKG EKG Interpretation  Date/Time:  Friday June 13 2018 17:35:16 EST Ventricular Rate:  82 PR Interval:  170 QRS Duration: 86 QT Interval:  360 QTC Calculation: 420 R Axis:   -4 Text Interpretation:  Normal sinus rhythm Low voltage QRS Cannot rule out Anterior infarct , age undetermined Abnormal ECG since last tracing no significant change Confirmed by Rolan BuccoBelfi, Ysabel Cowgill [phone]([id number]) on 06/13/2018 8:02:17 PM   Radiology Dg Chest 2 View  Result Date: 06/13/2018 CLINICAL DATA:  Cough and fever for 3 weeks. EXAM: CHEST - 2 VIEW COMPARISON:  April 10, 2012 FINDINGS: The heart size and mediastinal contours are within normal limits. Consolidation of the anterior left lung base is identified. The right lung is clear. There is no pleural effusion. The visualized skeletal structures are unremarkable. IMPRESSION: Left anterior lung base pneumonia. Electronically Signed    By: Sherian ReinWei-Chen  Lin M.D.   On: 06/13/2018 19:10    Procedures Procedures (including critical care time)  Medications Ordered in ED Medications  azithromycin (ZITHROMAX) 500 mg in sodium chloride 0.9 % 250 mL IVPB (500 mg Intravenous New Bag/Given 06/13/18 2130)  albuterol (PROVENTIL) (2.5 MG/3ML) 0.083% nebulizer solution 5 mg (has no administration in time range)  cefTRIAXone (ROCEPHIN) 1 g in sodium chloride 0.9 % 100 mL IVPB (0 g Intravenous Stopped 06/13/18 2057)  sodium chloride 0.9 % bolus 1,000 mL (1,000 mLs Intravenous New Bag/Given 06/13/18 2029)  ondansetron (ZOFRAN) injection 4 mg (4 mg Intravenous Given 06/13/18 2026)  ipratropium-albuterol (DUONEB) 0.5-2.5 (3) MG/3ML nebulizer solution 3 mL (3 mLs Nebulization Given 06/13/18 2007)     Initial Impression / Assessment and Plan / ED Course  I have reviewed the triage vital signs and the nursing notes.  Pertinent labs & imaging results that were available during my care of the patient were reviewed by me and considered in my medical decision making (see chart for details).     Patient is a 43 year old female who presents with cough wheezing and shortness of breath.  Chest x-ray shows evidence of pneumonia.  She was given 2 nebulizer treatments with some improvement of the wheezing but she still has wheezing and shortness of breath.  She still tachypneic.  She does not feel well enough to go home.  She was started on IV antibiotics.  She was given IV fluids.  I spoke with Dr. Mikeal Hawthorne who will admit the patient for further treatment.  CRITICAL CARE Performed by: Rolan Bucco Total critical care time: 45 minutes Critical care time was exclusive of separately billable procedures and treating other patients. Critical care was necessary to treat or prevent imminent or life-threatening deterioration. Critical care was time spent personally by me on the following activities: development of treatment plan with patient and/or surrogate as well  as nursing, discussions with consultants, evaluation of patient's response to treatment, examination of patient, obtaining history from patient or surrogate, ordering and performing treatments and interventions, ordering and review of laboratory studies, ordering and review of radiographic studies, pulse oximetry and re-evaluation of patient's condition.   Final Clinical Impressions(s) / ED Diagnoses   Final diagnoses:  Community acquired pneumonia of left lower lobe of lung [facility])    ED Discharge Orders    None       Rolan Bucco, MD 06/13/18 2219

## 2018-06-13 NOTE — ED Notes (Signed)
ED Provider at bedside. 

## 2018-06-13 NOTE — ED Notes (Signed)
Pt denies nausea at this time.

## 2018-06-13 NOTE — H&P (Signed)
History and Physical   Karla Carey VKP:[id number] DOB: 1975/10/14 DOA: 06/13/2018  Referring MD/NP/PA: Dr Gilmore Laroche  PCP: Patient, No Pcp Per   Patient coming from: Home  Chief Complaint: Shortness of breath  HPI: Karla Carey is a 43 y.o. female with medical history significant of asthma since teenage years, smoking and morbid obesity who presents to the ER with progressive shortness of breath and cough for 2 days.  She is seen in the ER with significant wheezing and cough.  She has some chest discomfort.  Denied any fever.  Denied any nausea vomiting or diarrhea.  Patient has tachypnea.  She responds to initial nebulizer but still remained hypoxic with poor air entry so she is being admitted with acute exacerbation of asthma and COPD  ED Course: Temperature 99.2 blood pressure is 103/63 pulse 92 respiratory rate of 16 oxygen sat 89% on room air. Chemistry appears normal CBC also normal.  Chest x-ray showed left anterior lung pneumonia.  Patient has been started on antibiotics and will be admitted for treatment.  Review of Systems: As per HPI otherwise 10 point review of systems negative.    Past Medical History:  Diagnosis Date  . Asthma   . Depression     Past Surgical History:  Procedure Laterality Date  . CESAREAN SECTION    . CHOLECYSTECTOMY    . TUBAL LIGATION       reports that she has been smoking cigarettes. She has been smoking about 1.00 pack per day. She has never used smokeless tobacco. She reports that she does not drink alcohol or use drugs.  Allergies  Allergen Reactions  . Asa Buff (Mag [Buffered Aspirin] Anaphylaxis  . Peanut Butter Flavor Anaphylaxis  . Codeine Other (See Comments)    Bad headache    No family history on file.   Prior to Admission medications   Medication Sig Start Date End Date Taking? Authorizing Provider  penicillin v potassium (VEETID) 500 MG tablet Take 1 tablet (500 mg total) by mouth 3 (three) times daily. Patient not taking:  Reported on 06/13/2018 05/02/18   Elvina Sidle, MD  traMADol (ULTRAM) 50 MG tablet Take 1 tablet (50 mg total) by mouth every 6 (six) hours as needed. Patient not taking: Reported on 06/13/2018 05/02/18   Elvina Sidle, MD    Physical Exam: Vitals:   06/13/18 2100 06/13/18 2115 06/13/18 2130 06/13/18 2215  BP:   103/63 (!) 108/56  Pulse: 81 84 79 92  Resp:      Temp:      TempSrc:      SpO2: 97% 99% 98% (!) 89%      Constitutional: NAD, tachypneic, slightly restless Vitals:   06/13/18 2100 06/13/18 2115 06/13/18 2130 06/13/18 2215  BP:   103/63 (!) 108/56  Pulse: 81 84 79 92  Resp:      Temp:      TempSrc:      SpO2: 97% 99% 98% (!) 89%   Eyes: PERRL, lids and conjunctivae normal ENMT: Mucous membranes are moist. Posterior pharynx clear of any exudate or lesions.Normal dentition.  Neck: normal, supple, no masses, no thyromegaly Respiratory: Decreased air entry bilaterally with marked expiratory wheezing, no crackles. Normal respiratory effort. No accessory muscle use.  Cardiovascular: Regular rate and rhythm, no murmurs / rubs / gallops. No extremity edema. 2+ pedal pulses. No carotid bruits.  Abdomen: no tenderness, no masses palpated. No hepatosplenomegaly. Bowel sounds positive.  Musculoskeletal: no clubbing / cyanosis. No joint deformity upper  and lower extremities. Good ROM, no contractures. Normal muscle tone.  Skin: no rashes, lesions, ulcers. No induration Neurologic: CN 2-12 grossly intact. Sensation intact, DTR normal. Strength 5/5 in all 4.  Psychiatric: Normal judgment and insight. Alert and oriented x 3. Normal mood.     Labs on Admission: I have personally reviewed following labs and imaging studies  CBC: Recent Labs  Lab 06/13/18 1734  WBC 5.7  HGB 11.9*  HCT 38.9  MCV 85.3  PLT 229   Basic Metabolic Panel: Recent Labs  Lab 06/13/18 1734  NA 135  K 4.3  CL 104  CO2 22  GLUCOSE 91  BUN 9  CREATININE 0.74  CALCIUM 9.6   GFR: CrCl  cannot be calculated (Unknown ideal weight.). Liver Function Tests: Recent Labs  Lab 06/13/18 1734  AST 18  ALT 18  ALKPHOS 47  BILITOT 0.4  PROT 7.1  ALBUMIN 3.3*   No results for input(s): LIPASE, AMYLASE in the last 168 hours. No results for input(s): AMMONIA in the last 168 hours. Coagulation Profile: No results for input(s): INR, PROTIME in the last 168 hours. Cardiac Enzymes: Recent Labs  Lab 06/13/18 1957  TROPONINI <0.03   BNP (last 3 results) No results for input(s): PROBNP in the last 8760 hours. HbA1C: No results for input(s): HGBA1C in the last 72 hours. CBG: No results for input(s): GLUCAP in the last 168 hours. Lipid Profile: No results for input(s): CHOL, HDL, LDLCALC, TRIG, CHOLHDL, LDLDIRECT in the last 72 hours. Thyroid Function Tests: No results for input(s): TSH, T4TOTAL, FREET4, T3FREE, THYROIDAB in the last 72 hours. Anemia Panel: No results for input(s): VITAMINB12, FOLATE, FERRITIN, TIBC, IRON, RETICCTPCT in the last 72 hours. Urine analysis:    Component Value Date/Time   COLORURINE YELLOW 12/02/2017 0232   APPEARANCEUR CLEAR 12/02/2017 0232   LABSPEC 1.026 12/02/2017 0232   PHURINE 6.0 12/02/2017 0232   GLUCOSEU NEGATIVE 12/02/2017 0232   HGBUR NEGATIVE 12/02/2017 0232   BILIRUBINUR NEGATIVE 12/02/2017 0232   KETONESUR NEGATIVE 12/02/2017 0232   PROTEINUR NEGATIVE 12/02/2017 0232   UROBILINOGEN 0.2 12/01/2017 1626   NITRITE NEGATIVE 12/02/2017 0232   LEUKOCYTESUR NEGATIVE 12/02/2017 0232   Sepsis Labs: @LABRCNTIP (procalcitonin:4,lacticidven:4) )No results found for this or any previous visit (from the past 240 hour(s)).   Radiological Exams on Admission: Dg Chest 2 View  Result Date: 06/13/2018 CLINICAL DATA:  Cough and fever for 3 weeks. EXAM: CHEST - 2 VIEW COMPARISON:  April 10, 2012 FINDINGS: The heart size and mediastinal contours are within normal limits. Consolidation of the anterior left lung base is identified. The right  lung is clear. There is no pleural effusion. The visualized skeletal structures are unremarkable. IMPRESSION: Left anterior lung base pneumonia. Electronically Signed   By: Sherian ReinWei-Chen  Lin M.D.   On: 06/13/2018 19:10    EKG: Independently reviewed.  Normal sinus rhythm with a rate of 82.  Low voltage QRS.  Assessment/Plan Principal Problem:   Community acquired pneumonia Active Problems:   Asthma exacerbation   Depression   Tobacco abuse     #1 community-acquired pneumonia: Patient will be admitted started on Rocephin and doxycycline.  Sputum cultures and blood cultures will be obtained.  Oxygen will be used.  #2 asthma/COPD exacerbation: Patient has a history of asthma but again smokes about 1-1/2 pack/day.  I suspect she has COPD also.  We will treat her with IV Solu-Medrol, antibiotics and nebulizer treatment  #3 tobacco abuse: Nicotine patch will be added.  Patient will  also be counseled.  #4 depression: Continue home regimen.  #5 morbid obesity: Dietary counseling.   DVT prophylaxis: Lovenox Code Status: Full code Family Communication: Boyfriend at bedside Disposition Plan: Home Consults called: None Admission status: Inpatient  Severity of Illness: The appropriate patient status for this patient is INPATIENT. Inpatient status is judged to be reasonable and necessary in order to provide the required intensity of service to ensure the patient's safety. The patient's presenting symptoms, physical exam findings, and initial radiographic and laboratory data in the context of their chronic comorbidities is felt to place them at high risk for further clinical deterioration. Furthermore, it is not anticipated that the patient will be medically stable for discharge from the hospital within 2 midnights of admission. The following factors support the patient status of inpatient.   " The patient's presenting symptoms include shortness of breath. " The worrisome physical exam findings  include marked expiratory wheezing. " The initial radiographic and laboratory data are worrisome because of left lower lobe pneumonia on x-ray. " The chronic co-morbidities include asthma with possible COPD.   * I certify that at the point of admission it is my clinical judgment that the patient will require inpatient hospital care spanning beyond 2 midnights from the point of admission due to high intensity of service, high risk for further deterioration and high frequency of surveillance required.Lonia Blood*    Yoshimi Sarr,LAWAL MD [facility] Pager [phone]  If 7PM-7AM, please contact night-coverage [email or web address] Password [email or web address]  06/13/2018, 11:06 PM

## 2018-06-14 ENCOUNTER — Encounter (HOSPITAL_COMMUNITY): Payer: Self-pay

## 2018-06-14 ENCOUNTER — Other Ambulatory Visit: Payer: Self-pay

## 2018-06-14 LAB — CBC
HCT: 34.2 % — ABNORMAL LOW (ref 36.0–46.0)
HCT: 36 % (ref 36.0–46.0)
Hemoglobin: 10.4 g/dL — ABNORMAL LOW (ref 12.0–15.0)
Hemoglobin: 11.2 g/dL — ABNORMAL LOW (ref 12.0–15.0)
MCH: 25.9 pg — ABNORMAL LOW (ref 26.0–34.0)
MCH: 26.9 pg (ref 26.0–34.0)
MCHC: 30.4 g/dL (ref 30.0–36.0)
MCHC: 31.1 g/dL (ref 30.0–36.0)
MCV: 85.1 fL (ref 80.0–100.0)
MCV: 86.3 fL (ref 80.0–100.0)
NRBC: 0 % (ref 0.0–0.2)
NRBC: 0 % (ref 0.0–0.2)
Platelets: 190 10*3/uL (ref 150–400)
Platelets: 186 10*3/uL (ref 150–400)
RBC: 4.02 MIL/uL (ref 3.87–5.11)
RBC: 4.17 MIL/uL (ref 3.87–5.11)
RDW: 17.4 % — ABNORMAL HIGH (ref 11.5–15.5)
RDW: 17.6 % — ABNORMAL HIGH (ref 11.5–15.5)
WBC: 4.8 10*3/uL (ref 4.0–10.5)
WBC: 5.3 10*3/uL (ref 4.0–10.5)

## 2018-06-14 LAB — BASIC METABOLIC PANEL
Anion gap: 10 (ref 5–15)
BUN: 10 mg/dL (ref 6–20)
CO2: 21 mmol/L — ABNORMAL LOW (ref 22–32)
Calcium: 9.1 mg/dL (ref 8.9–10.3)
Chloride: 106 mmol/L (ref 98–111)
Creatinine, Ser: 0.95 mg/dL (ref 0.44–1.00)
GFR calc non Af Amer: >60 mL/min (ref >60)
Glucose, Bld: 137 mg/dL — ABNORMAL HIGH (ref 70–99)
Potassium: 4 mmol/L (ref 3.5–5.1)
Sodium: 137 mmol/L (ref 135–145)

## 2018-06-14 LAB — CREATININE, SERUM
Creatinine, Ser: 0.75 mg/dL (ref 0.44–1.00)
GFR calc non Af Amer: >60 mL/min (ref >60)

## 2018-06-14 LAB — HIV ANTIBODY (ROUTINE TESTING W REFLEX): HIV Screen 4th Generation wRfx: NONREACTIVE

## 2018-06-14 LAB — INFLUENZA PANEL BY PCR (TYPE A & B)
Influenza A By PCR: NEGATIVE
Influenza B By PCR: NEGATIVE

## 2018-06-14 MED ORDER — ONDANSETRON HCL 4 MG/2ML IJ SOLN: 4.0000 mg | Freq: Four times a day (QID) | INTRAMUSCULAR | Status: DC | PRN

## 2018-06-14 MED ORDER — ALBUTEROL SULFATE (2.5 MG/3ML) 0.083% IN NEBU
2.5000 mg | INHALATION_SOLUTION | Freq: Four times a day (QID) | RESPIRATORY_TRACT | Status: DC
  Administered 2018-06-14 – 2018-06-15 (×3): 2.5 mg via RESPIRATORY_TRACT
  Filled 2018-06-14 (×4): qty 3

## 2018-06-14 MED ORDER — IPRATROPIUM-ALBUTEROL 0.5-2.5 (3) MG/3ML IN SOLN
3.0000 mL | Freq: Three times a day (TID) | RESPIRATORY_TRACT | Status: DC
  Administered 2018-06-14: 3 mL via RESPIRATORY_TRACT
  Filled 2018-06-14: qty 3

## 2018-06-14 MED ORDER — PREDNISONE 20 MG PO TABS
50.0000 mg | ORAL_TABLET | Freq: Every day | ORAL | Status: DC
  Administered 2018-06-15: 50 mg via ORAL
  Filled 2018-06-14: qty 2

## 2018-06-14 MED ORDER — ACETAMINOPHEN 325 MG PO TABS: 650.0000 mg | ORAL_TABLET | Freq: Four times a day (QID) | ORAL | Status: DC | PRN

## 2018-06-14 MED ORDER — LIDOCAINE-PRILOCAINE 2.5-2.5 % EX CREA
TOPICAL_CREAM | CUTANEOUS | Status: DC
  Filled 2018-06-14: qty 5

## 2018-06-14 NOTE — Progress Notes (Signed)
Nutrition Brief Note  RD consulted as part of COPD gold protocol.   Pt reports PTA, she had had a poor intake for roughly 1 week and had only been eating 1x per day. Usually, she eats 3 times a day.   However, she says her poor appetite has resolved. She says she ate 100% of both her breakfast and lunch today. RD did offer oral supplements (premier protein), but she declined.   Weight wise, she denies any unintentional weight changes. Per chart, she has been 94-98 kg for the better part of a decade. While weight loss would be beneficial, this is not appropriate setting to have that discussion.   No nutrition interventions warranted at this time. If nutrition issues arise, please consult RD.   Christophe Louis RD, LDN, CNSC Clinical Nutrition Available Tues-Sat via Pager: [phone] 06/14/2018 5:11 PM

## 2018-06-14 NOTE — Progress Notes (Addendum)
PT Cancellation Note  Patient Details Name: JENESIS AMARAL MRN: [id number] DOB: 1975-08-30   Cancelled Treatment:    Reason Eval/Treat Not Completed: Other (comment). PT orders cancelled by physician.  Laurina Bustle, PT, DPT Acute Rehabilitation Services Pager [phone] Office [phone]    Vanetta Mulders 06/14/2018, 9:50 AM

## 2018-06-14 NOTE — Progress Notes (Signed)
OT Cancellation Note and Discharge  Patient Details Name: Karla Carey MRN: [id number] DOB: 1975-09-09   Cancelled Treatment:    Reason Eval/Treat Not Completed: Other (comment).OT orders cancelled by physician. Ignacia Palma, OTR/L Acute Rehab Services Pager [phone] Office [phone]     Evette Georges 06/14/2018, 2:49 PM

## 2018-06-14 NOTE — Progress Notes (Signed)
[facility] Progress Note  Patient: Karla Carey TGY:[id number]   PCP: Patient, No Pcp Per DOB: [birth year]   DOA: 06/13/2018   DOS: 06/14/2018   Date of Service: the patient was seen and examined on 06/14/2018  Brief hospital course: Pt. with PMH of asthma, active smoker, morbid obesity; admitted on 06/13/2018, presented with complaint of shortness of breath, was found to have asthma exacerbation. Currently further plan is continue supportive measures.  Subjective: Still having shortness of breath but somewhat improvement.  No nausea no vomiting.  Still has cough.  No chest pain.  No abdominal pain.  No dizziness no lightheadedness.  No diarrhea.  Assessment and Plan: 1.  Left anterior pneumonia. Asthma exacerbation.  With bronchospasm Continue with albuterol nebulization Continue with steroids. Continue with doxycycline. Continue with oxygen for support. Influenza PCR is negative.  2.  Active smoker. Continue nicotine patch. Counseled for quitting. Patient not willing yet.  3.  Depression. Continue home regimen.  4.  Morbid obesity  Body mass index is 42.75 kg/m.  Dietary consultation.  Diet: cardiac diet DVT Prophylaxis: subcutaneous Heparin  Advance goals of care discussion: full code  Family Communication: no family was present at bedside, at the time of interview.  Disposition:  Discharge to home.  Consultants: none Procedures: none  Scheduled Meds: . albuterol  2.5 mg Nebulization QID  . doxycycline  100 mg Oral Q12H  . enoxaparin (LOVENOX) injection  40 mg Subcutaneous Daily  . nicotine  21 mg Transdermal Daily  . [START ON 06/15/2018] predniSONE  50 mg Oral Q breakfast   Continuous Infusions: PRN Meds: acetaminophen, ondansetron (ZOFRAN) IV Antibiotics: Anti-infectives (From admission, onward)   Start     Dose/Rate Route Frequency Ordered Stop   06/14/18 0000  doxycycline (VIBRA-TABS) tablet 100 mg     100 mg Oral Every 12 hours 06/13/18 2354  06/18/18 2159   06/13/18 2000  cefTRIAXone (ROCEPHIN) 1 g in sodium chloride 0.9 % 100 mL IVPB     1 g 200 mL/hr over 30 Minutes Intravenous  Once 06/13/18 1956 06/13/18 2057   06/13/18 2000  azithromycin (ZITHROMAX) 500 mg in sodium chloride 0.9 % 250 mL IVPB     500 mg 250 mL/hr over 60 Minutes Intravenous  Once 06/13/18 1956 06/13/18 2230       Objective: Physical Exam: Vitals:   06/14/18 0013 06/14/18 0819 06/14/18 0848 06/14/18 1158  BP: 106/62 (!) 100/59    Pulse: 94 68    Resp:  18    Temp: 98.5 F (36.9 C) 98.1 F (36.7 C)    TempSrc: Oral Oral    SpO2: 94% 95% 93% 94%  Weight:        Intake/Output Summary (Last 24 hours) at 06/14/2018 1321 Last data filed at 06/13/2018 2254 Gross per 24 hour  Intake 1350 ml  Output -  Net 1350 ml   Filed Weights   06/14/18 0000  Weight: 96 kg   General: Alert, Awake and Oriented to Time, Place and Person. Appear in moderate distress, affect appropriate Eyes: PERRL, Conjunctiva normal ENT: Oral Mucosa clear moist. Neck: no JVD, no Abnormal Mass Or lumps Cardiovascular: S1 and S2 Present, no Murmur, Peripheral Pulses Present Respiratory: increased respiratory effort, Bilateral Air entry equal and Decreased, no use of accessory muscle, bilateral  Crackles, bilateral  wheezes Abdomen: Bowel Sound present, Soft and no tenderness, n hernia Skin: ono redness, no Rash, no induration Extremities: no Pedal edema, no calf tenderness Neurologic: Grossly no focal neuro  deficit. Bilaterally Equal motor strength  Data Reviewed: CBC: Recent Labs  Lab 06/13/18 1734 06/14/18 0019 06/14/18 0317  WBC 5.7 4.8 5.3  HGB 11.9* 10.4* 11.2*  HCT 38.9 34.2* 36.0  MCV 85.3 85.1 86.3  PLT 229 190 186   Basic Metabolic Panel: Recent Labs  Lab 06/13/18 1734 06/14/18 0019 06/14/18 0317  NA 135  --  137  K 4.3  --  4.0  CL 104  --  106  CO2 22  --  21*  GLUCOSE 91  --  137*  BUN 9  --  10  CREATININE 0.74 0.75 0.95  CALCIUM 9.6  --  9.1     Liver Function Tests: Recent Labs  Lab 06/13/18 1734  AST 18  ALT 18  ALKPHOS 47  BILITOT 0.4  PROT 7.1  ALBUMIN 3.3*   No results for input(s): LIPASE, AMYLASE in the last 168 hours. No results for input(s): AMMONIA in the last 168 hours. Coagulation Profile: No results for input(s): INR, PROTIME in the last 168 hours. Cardiac Enzymes: Recent Labs  Lab 06/13/18 1957  TROPONINI <0.03   BNP (last 3 results) No results for input(s): PROBNP in the last 8760 hours. CBG: No results for input(s): GLUCAP in the last 168 hours. Studies: Dg Chest 2 View  Result Date: 06/13/2018 CLINICAL DATA:  Cough and fever for 3 weeks. EXAM: CHEST - 2 VIEW COMPARISON:  April 10, 2012 FINDINGS: The heart size and mediastinal contours are within normal limits. Consolidation of the anterior left lung base is identified. The right lung is clear. There is no pleural effusion. The visualized skeletal structures are unremarkable. IMPRESSION: Left anterior lung base pneumonia. Electronically Signed   By: Sherian ReinWei-Chen  Lin M.D.   On: 06/13/2018 19:10     Time spent: 35 minutes  Author: Lynden OxfordPranav Dakota Vanwart, MD [facility] 06/14/2018 1:21 PM  Between 7PM-7AM, please contact night-coverage at [email or web address]

## 2018-06-15 ENCOUNTER — Inpatient Hospital Stay (HOSPITAL_COMMUNITY): Payer: Medicaid Other

## 2018-06-15 LAB — HCG, QUANTITATIVE, PREGNANCY: hCG, Beta Chain, Quant, S: 1 m[IU]/mL (ref <5)

## 2018-06-15 LAB — PROTIME-INR
INR: 1.04
Prothrombin Time: 13.5 seconds (ref 11.4–15.2)

## 2018-06-15 LAB — TSH: TSH: 0.747 u[IU]/mL (ref 0.350–4.500)

## 2018-06-15 LAB — APTT: aPTT: 26 seconds (ref 24–36)

## 2018-06-15 MED ORDER — NICOTINE 21 MG/24HR TD PT24: 21.0000 mg | MEDICATED_PATCH | Freq: Every day | TRANSDERMAL | 0 refills | Status: DC

## 2018-06-15 MED ORDER — ALBUTEROL SULFATE (2.5 MG/3ML) 0.083% IN NEBU: 2.5000 mg | INHALATION_SOLUTION | RESPIRATORY_TRACT | Status: DC | PRN

## 2018-06-15 MED ORDER — ESTROGENS CONJUGATED 1.25 MG PO TABS: 2.5000 mg | ORAL_TABLET | Freq: Two times a day (BID) | ORAL | 0 refills | Status: DC

## 2018-06-15 MED ORDER — PREDNISONE 10 MG PO TABS: ORAL_TABLET | ORAL | 0 refills | Status: DC

## 2018-06-15 MED ORDER — DOXYCYCLINE HYCLATE 100 MG PO TABS: 100.0000 mg | ORAL_TABLET | Freq: Two times a day (BID) | ORAL | 0 refills | Status: AC

## 2018-06-15 MED ORDER — FERROUS SULFATE 325 (65 FE) MG PO TABS: 325.0000 mg | ORAL_TABLET | Freq: Every day | ORAL | 0 refills | Status: DC

## 2018-06-15 NOTE — Discharge Instructions (Signed)
Abnormal Uterine Bleeding  Abnormal uterine bleeding is unusual bleeding from the uterus. It includes:   Bleeding or spotting between periods.   Bleeding after sex.   Bleeding that is heavier than normal.   Periods that last longer than usual.   Bleeding after menopause.  Abnormal uterine bleeding can affect women at various stages in life, including teenagers, women in their reproductive years, pregnant women, and women who have reached menopause. Common causes of abnormal uterine bleeding include:   Pregnancy.   Growths of tissue (polyps).   A noncancerous tumor in the uterus (fibroid).   Infection.   Cancer.   Hormonal imbalances.  Any type of abnormal bleeding should be evaluated by a health care provider. Many cases are minor and simple to treat, while others are more serious. Treatment will depend on the cause of the bleeding.  Follow these instructions at home:   Monitor your condition for any changes.   Do not use tampons, douche, or have sex if told by your health care provider.   Change your pads often.   Get regular exams that include pelvic exams and cervical cancer screening.   Keep all follow-up visits as told by your health care provider. This is important.  Contact a health care provider if:   Your bleeding lasts for more than one week.   You feel dizzy at times.   You feel nauseous or you vomit.  Get help right away if:   You pass out.   Your bleeding soaks through a pad every hour.   You have abdominal pain.   You have a fever.   You become sweaty or weak.   You pass large blood clots from your vagina.  Summary   Abnormal uterine bleeding is unusual bleeding from the uterus.   Any type of abnormal bleeding should be evaluated by a health care provider. Many cases are minor and simple to treat, while others are more serious.   Treatment will depend on the cause of the bleeding.  This information is not intended to replace advice given to you by your health care provider.  Make sure you discuss any questions you have with your health care provider.  Document Released: 05/14/2005 Document Revised: 06/15/2016 Document Reviewed: 06/15/2016  Elsevier Interactive Patient Education  2019 [employer] Inc[employer]

## 2018-06-15 NOTE — Progress Notes (Signed)
Per Medicaid, PCP is: Primary Care Provider: [facility]  Address: [address] [place], [place] [address]  Contact: [facility]  Telephone: [phone]  Telephone: [phone]   Patient will need to contact her Medicaid case worker to have this reassigned.

## 2018-06-15 NOTE — Progress Notes (Signed)
Telemetry notified me of pt sustaining sinus brady on the monitor running between 38-48 bpm with frequent SVPB's. Pt sleeping and no other symptoms at this time.  I notified on call doc.

## 2018-06-15 NOTE — Progress Notes (Signed)
SATURATION QUALIFICATIONS: (This note is used to comply with regulatory documentation for home oxygen)  Patient Saturations on Room Air at Rest = 97%  Patient Saturations on Room Air while Ambulating 96-97 %  Patient Saturations on __ Liters of oxygen while Ambulating = N/A. Pt on RA.

## 2018-06-19 NOTE — Discharge Summary (Signed)
[facility] Discharge Summary   Patient: Karla Carey WUJ:[id number]   PCP: Patient, No Pcp Per DOB: 05-12-1976   Date of admission: 06/13/2018   Date of discharge: 06/15/2018     Discharge Diagnoses:  Principal Problem:   Community acquired pneumonia Active Problems:   Asthma exacerbation   Depression   Tobacco abuse   Admitted From: home Disposition:  home  Recommendations for Outpatient Follow-up:  1. please follow up with PCP in 1 week  2. Recommendation is to follow up with Gyn  Follow-up Information    PCP. Schedule an appointment as soon as possible for a visit in 1 week(s).        OB-GYN. Schedule an appointment as soon as possible for a visit in 2 week(s).          Diet recommendation: regular diet  Activity: The patient is advised to gradually reintroduce usual activities.  Discharge Condition: good  Code Status: full code  History of present illness: As per the H and P dictated on admission, "Karla ChurnWendy R Althouse is a 43 y.o. female with medical history significant of asthma since teenage years, smoking and morbid obesity who presents to the ER with progressive shortness of breath and cough for 2 days.  She is seen in the ER with significant wheezing and cough.  She has some chest discomfort.  Denied any fever.  Denied any nausea vomiting or diarrhea.  Patient has tachypnea.  She responds to initial nebulizer but still remained hypoxic with poor air entry so she is being admitted with acute exacerbation of asthma and COPD  ED Course: Temperature 99.2 blood pressure is 103/63 pulse 92 respiratory rate of 16 oxygen sat 89% on room air. Chemistry appears normal CBC also normal.  Chest x-ray showed left anterior lung pneumonia.  Patient has been started on antibiotics and will be admitted for treatment."  Hospital Course:  Summary of her active problems in the hospital is as following.  1.  Left anterior pneumonia. Asthma exacerbation.  With bronchospasm Continue  with inhalers. Continue with steroids. Continue with doxycycline. Continue with oxygen for support. Influenza PCR is negative.  2.  Active smoker. Continue nicotine patch. Counseled for quitting. Patient not willing yet.  3.  Depression. Continue home regimen.  4.  Morbid obesity  Body mass index is 42.75 kg/m.  Dietary consultation.  5. Menorrhagia US pelvis negative for any major abnormality Not pregnant.  INR APTT normal.  Hb remains stable.  Provide high dose estrogen and recommended to follow up with GYN.   Patient was ambulatory without any assistance. On the day of the discharge the patient's vitals were stable , and no other acute medical condition were reported by patient. the patient was felt safe to be discharge at home with family.  Consultants: none Procedures: none  DISCHARGE MEDICATION: Allergies as of 06/15/2018      Reactions   Asa Buff (mag [buffered Aspirin] Anaphylaxis   Peanut Butter Flavor Anaphylaxis   Codeine Other (See Comments)   Bad headache      Medication List    STOP taking these medications   penicillin v potassium 500 MG tablet Commonly known as:  VEETID   traMADol 50 MG tablet Commonly known as:  ULTRAM     TAKE these medications   estrogens (conjugated) 1.25 MG tablet Commonly known as:  PREMARIN Take 2 tablets (2.5 mg total) by mouth 2 (two) times daily for 4 days.   ferrous sulfate 325 (65 FE) MG  tablet Take 1 tablet (325 mg total) by mouth daily with breakfast for 30 days.   nicotine 21 mg/24hr patch Commonly known as:  NICODERM CQ - dosed in mg/24 hours Place 1 patch (21 mg total) onto the skin daily.   predniSONE 10 MG tablet Commonly known as:  DELTASONE Take 40mg  daily for 3days,Take 30mg  daily for 3days,Take 20mg  daily for 3days,Take 10mg  daily for 3days, then stop     ASK your doctor about these medications   doxycycline 100 MG tablet Commonly known as:  VIBRA-TABS Take 1 tablet (100 mg total) by mouth  2 (two) times daily for 3 days. Ask about: Should I take this medication?      Allergies  Allergen Reactions  . Asa Buff (Mag [Buffered Aspirin] Anaphylaxis  . Peanut Butter Flavor Anaphylaxis  . Codeine Other (See Comments)    Bad headache   Discharge Instructions    Diet - low sodium heart healthy   Complete by:  As directed    Discharge instructions   Complete by:  As directed    It is important that you read the given instructions as well as go over your medication list with RN to help you understand your care after this hospitalization.  Discharge Instructions: Please follow-up with PCP in 1-2 weeks  Please request your primary care physician to go over all Hospital Tests and Procedure/Radiological results at the follow up. Please get all Hospital records sent to your PCP by signing hospital release before you go home.   You were cared for by a hospitalist during your hospital stay. If you have any questions about your discharge medications or the care you received while you were in the hospital after you are discharged, you can call the unit @UNIT @ you were admitted to and ask to speak with the hospitalist on call if the hospitalist that took care of you is not available.  Once you are discharged, your primary care physician will handle any further medical issues. Please note that NO REFILLS for any discharge medications will be authorized once you are discharged, as it is imperative that you return to your primary care physician (or establish a relationship with a primary care physician if you do not have one) for your aftercare needs so that they can reassess your need for medications and monitor your lab values. You Must read complete instructions/literature along with all the possible adverse reactions/side effects for all the Medicines you take and that have been prescribed to you. Take any new Medicines after you have completely understood and accept all the possible adverse  reactions/side effects. Wear Seat belts while driving. If you have smoked or chewed Tobacco in the last 2 yrs please stop smoking and/or stop any Recreational drug use.  If you drink alcohol, please moderate the use and do not drive, operating heavy machinery, perform activities at heights, swimming or participation in water activities or provide baby sitting services under influence.   Increase activity slowly   Complete by:  As directed      Discharge Exam: Filed Weights   06/14/18 0000  Weight: 96 kg   Vitals:   06/15/18 0945 06/15/18 1623  BP: (!) 141/85 (!) 148/82  Pulse: 78 71  Resp: 18 18  Temp: 97.6 F (36.4 C) 97.6 F (36.4 C)  SpO2: 95% 97%   General: Appear in no distress, no Rash; Oral Mucosa moist. Cardiovascular: S1 and S2 Present, no Murmur, no JVD Respiratory: Bilateral Air entry present and Clear  to Auscultation, no Crackles, no wheezes Abdomen: Bowel Sound present, Soft and no tenderness Extremities: no Pedal edema, no calf tenderness Neurology: Grossly no focal neuro deficit.  The results of significant diagnostics from this hospitalization (including imaging, microbiology, ancillary and laboratory) are listed below for reference.    Significant Diagnostic Studies: Dg Chest 2 View  Result Date: 06/13/2018 CLINICAL DATA:  Cough and fever for 3 weeks. EXAM: CHEST - 2 VIEW COMPARISON:  April 10, 2012 FINDINGS: The heart size and mediastinal contours are within normal limits. Consolidation of the anterior left lung base is identified. The right lung is clear. There is no pleural effusion. The visualized skeletal structures are unremarkable. IMPRESSION: Left anterior lung base pneumonia. Electronically Signed   By: Sherian ReinWei-Chen  Lin M.D.   On: 06/13/2018 19:10   [place] Pelvic Complete With Transvaginal  Result Date: 06/15/2018 CLINICAL DATA:  Menorrhagia for 2 weeks. EXAM: TRANSABDOMINAL AND TRANSVAGINAL ULTRASOUND OF PELVIS TECHNIQUE: Both transabdominal and  transvaginal ultrasound examinations of the pelvis were performed. Transabdominal technique was performed for global imaging of the pelvis including uterus, ovaries, adnexal regions, and pelvic cul-de-sac. It was necessary to proceed with endovaginal exam following the transabdominal exam to visualize the bilateral ovaries. COMPARISON:  None FINDINGS: Uterus Measurements: 11.7 x 5.3 x 5.6 cm = volume: 180 mL. There is mild heterogeneous echotexture of the uterus. At the fundus of the uterus, there is a 3.5 x 3.1 x 3.5 cm uterine fibroid. The uterus is anteverted and anteflexed. Endometrium Thickness: 20 mm.  No focal abnormality visualized. Right ovary Measurements: 4.7 x 3.4 x 4.5 cm = volume: 37 mL. There is a cyst in the right ovary measures 4 x 2.9 x 3.4 cm. Left ovary Not visualized. Other findings No abnormal free fluid. IMPRESSION: Uterine fibroid. The endometrium measures 20 mm.  This can be normal for patient age. Right ovarian cyst, likely follicular cyst. Electronically Signed   By: Sherian ReinWei-Chen  Lin M.D.   On: 06/15/2018 13:43    Microbiology: No results found for this or any previous visit (from the past 240 hour(s)).   Labs: CBC: Recent Labs  Lab 06/13/18 1734 06/14/18 0019 06/14/18 0317  WBC 5.7 4.8 5.3  HGB 11.9* 10.4* 11.2*  HCT 38.9 34.2* 36.0  MCV 85.3 85.1 86.3  PLT 229 190 186   Basic Metabolic Panel: Recent Labs  Lab 06/13/18 1734 06/14/18 0019 06/14/18 0317  NA 135  --  137  K 4.3  --  4.0  CL 104  --  106  CO2 22  --  21*  GLUCOSE 91  --  137*  BUN 9  --  10  CREATININE 0.74 0.75 0.95  CALCIUM 9.6  --  9.1   Liver Function Tests: Recent Labs  Lab 06/13/18 1734  AST 18  ALT 18  ALKPHOS 47  BILITOT 0.4  PROT 7.1  ALBUMIN 3.3*   No results for input(s): LIPASE, AMYLASE in the last 168 hours. No results for input(s): AMMONIA in the last 168 hours. Cardiac Enzymes: Recent Labs  Lab 06/13/18 1957  TROPONINI <0.03   BNP (last 3 results) No results  for input(s): BNP in the last 8760 hours. CBG: No results for input(s): GLUCAP in the last 168 hours. Time spent: 35 minutes  Signed:  Lynden Oxfordranav Jerry Clyne  [facility] 06/15/2018

## 2019-01-17 IMAGING — CT CT ABD-PELV W/ CM
2 of 5 series · 16 of 46 positions shown, 18 images · IV contrast (APPLIED)
Comparison: CT 12/05/2012, 03/27/2011

CLINICAL DATA: Right lower quadrant pain.  Nausea and vomiting.

EXAM:
CT ABDOMEN AND PELVIS WITH CONTRAST
TECHNIQUE: Multidetector CT imaging of the abdomen and pelvis was performed
using the standard protocol following bolus administration of
intravenous contrast.
CONTRAST:  100mL OMNIPAQUE IOHEXOL 300 MG/ML  SOLN

[Series 3: abdomen 5.0 · axial · 0.82mm/px · z∈[+968,+1348]mm · 13 of 88 slices shown, 15 images]
[im 6/88  soft-tissue]
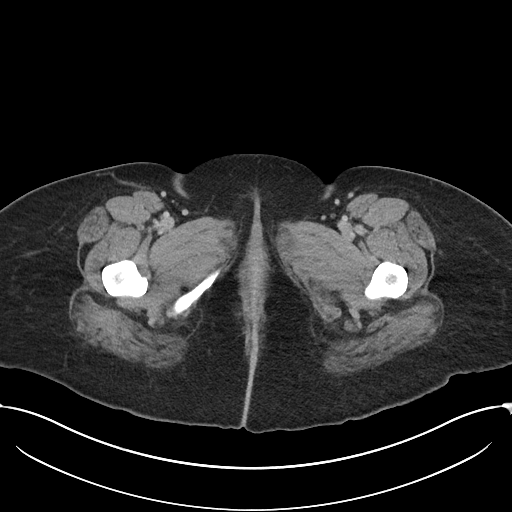
[im 6/88  bone]
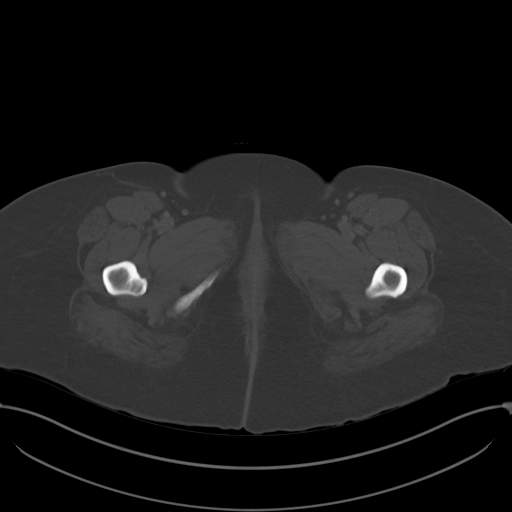
[im 11/88  soft-tissue]
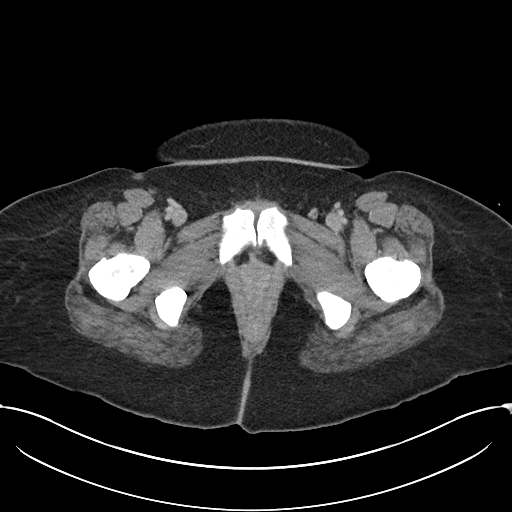
[im 21/88  soft-tissue]
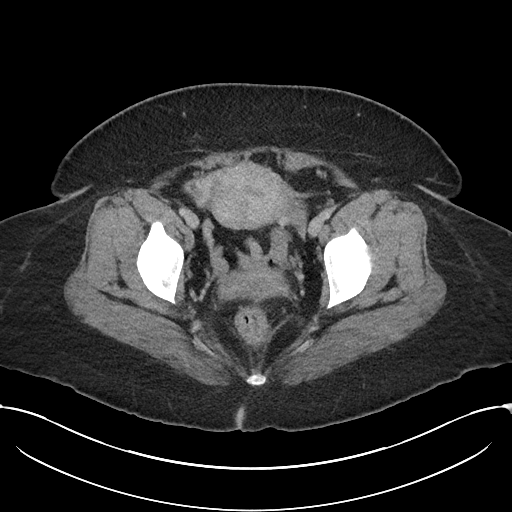
[im 26/88  soft-tissue]
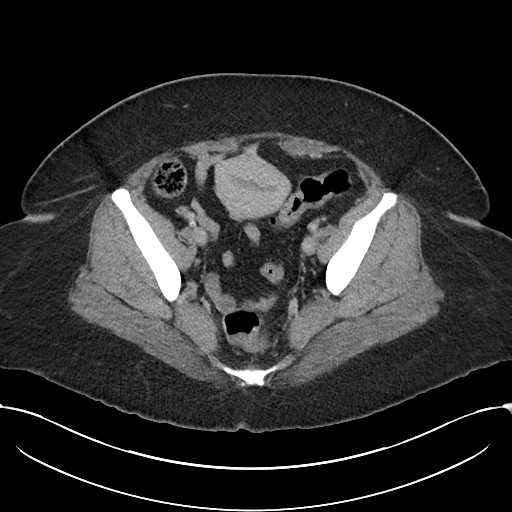
[im 31/88  soft-tissue]
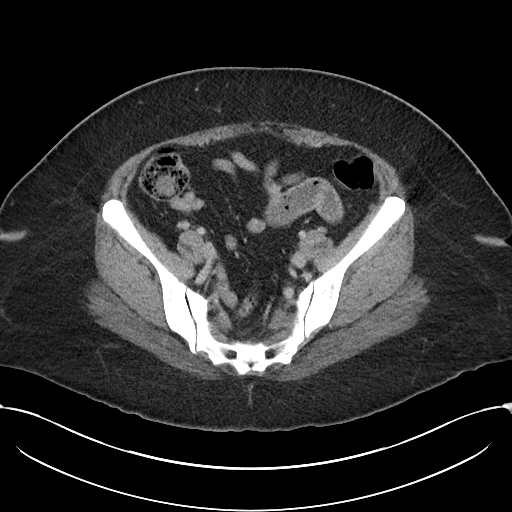
[im 36/88  soft-tissue]
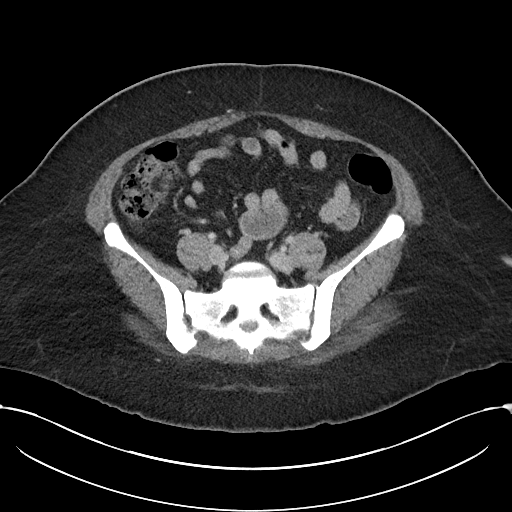
[im 47/88  soft-tissue]
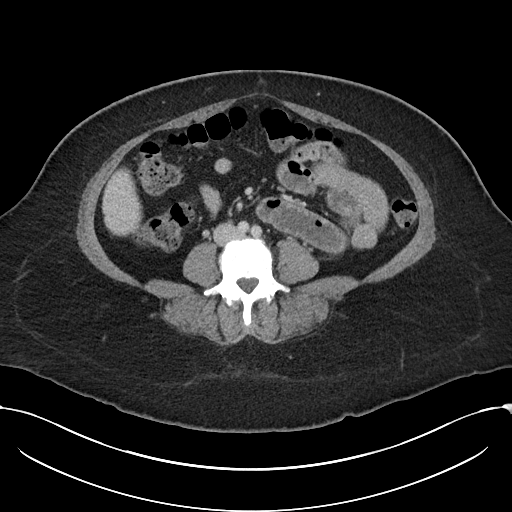
[im 52/88  soft-tissue]
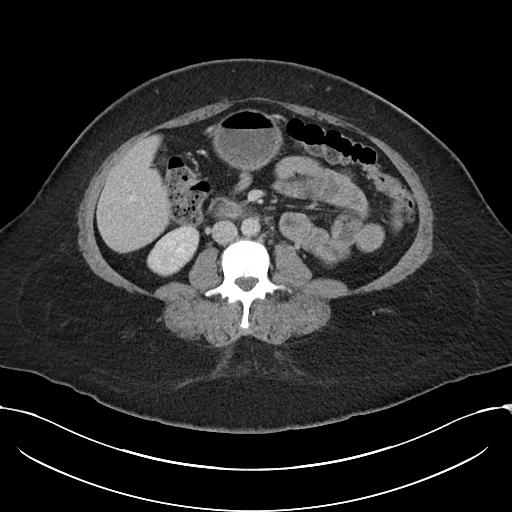
[im 57/88  soft-tissue]
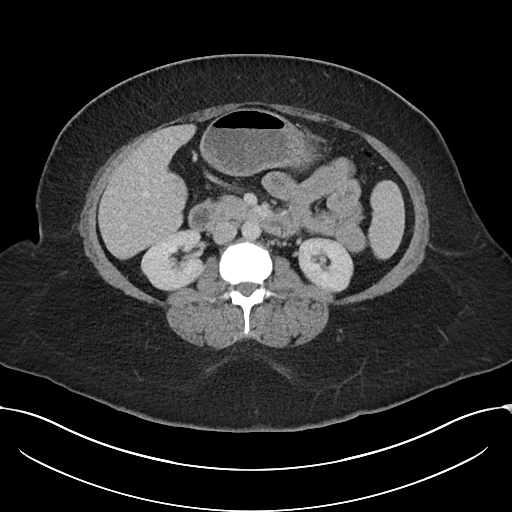
[im 57/88  bone]
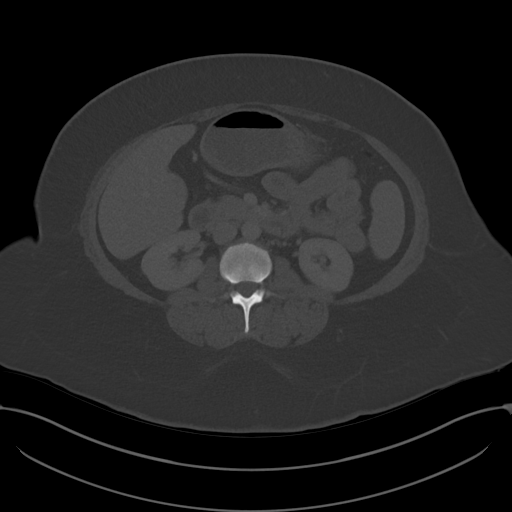
[im 62/88  soft-tissue]
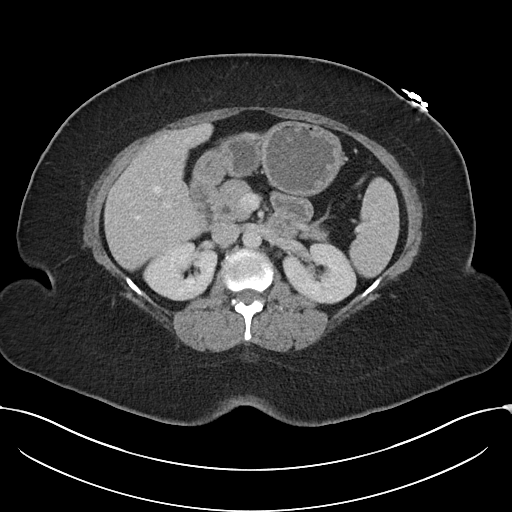
[im 67/88  soft-tissue]
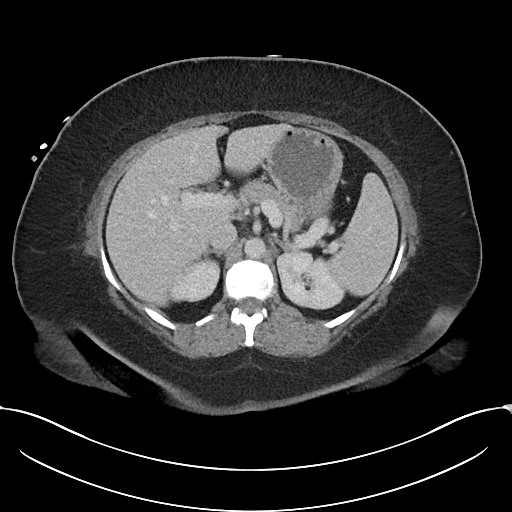
[im 77/88  soft-tissue]
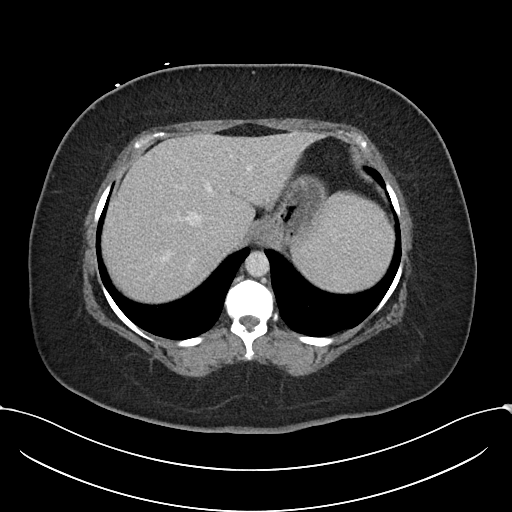
[im 82/88  soft-tissue]
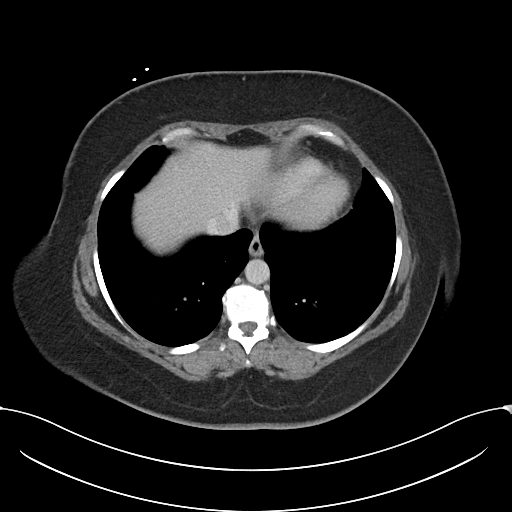

[Series 6: abdomen 3.0 mpr cor · coronal · 0.75mm/px · 3 of 88 slices shown]
[im 30/88  soft-tissue]
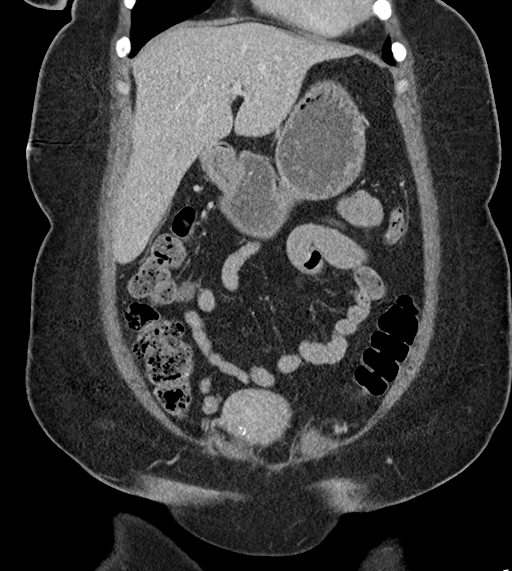
[im 39/88  soft-tissue]
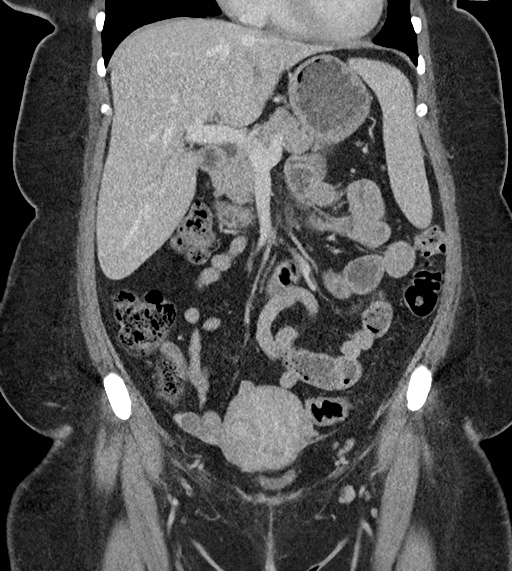
[im 49/88  soft-tissue]
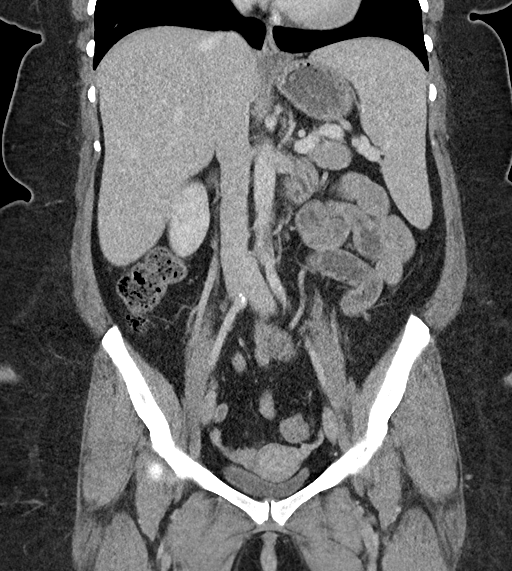

[16 of 46 positions shown; findings below may reference images not displayed]

FINDINGS: Lower chest: Heterogeneous attenuation of the lung bases similar to
chest CT 03/04/2017, suggesting underlying air trapping. No
consolidation or pleural fluid. Normal heart size.

Hepatobiliary: No focal liver abnormality is seen. Status post
cholecystectomy. No biliary dilatation.

Pancreas: No ductal dilatation or inflammation.

Spleen: Mildly enlarged spanning 15 cm cranial caudal.

Adrenals/Urinary Tract: No adrenal nodule. No hydronephrosis or
perinephric edema. Homogeneous renal enhancement with symmetric
excretion on delayed phase imaging. Urinary bladder is partially
distended without wall thickening.

Stomach/Bowel: Appendix not well visualized, no pericecal or right
lower quadrant inflammation to suggest appendicitis. Appendix was
also not well visualized on prior exams. No bowel inflammation,
inflammatory change or obstruction. No bowel wall thickening.

Vascular/Lymphatic: No significant vascular findings are present. No
enlarged abdominal or pelvic lymph nodes.

Reproductive: The uterus is displaced anteriorly as seen previously,
questionable fibroids posteriorly. No adnexal mass.

Other: No free air, free fluid, or intra-abdominal fluid collection.

Musculoskeletal: There are no acute or suspicious osseous
abnormalities.
IMPRESSION: 1. No acute findings or explanation for abdominal pain. No evidence
of appendicitis.
2. Mild splenomegaly.
3. Possible uterine fibroids.

## 2019-05-01 ENCOUNTER — Ambulatory Visit (INDEPENDENT_AMBULATORY_CARE_PROVIDER_SITE_OTHER): Payer: Medicaid Other

## 2019-05-01 ENCOUNTER — Ambulatory Visit (HOSPITAL_COMMUNITY)
Admission: EM | Admit: 2019-05-01 | Discharge: 2019-05-01 | Disposition: A | Discharge status: Home / Self Care | Payer: Medicaid Other | Attending: Family Medicine | Admitting: Family Medicine

## 2019-05-01 ENCOUNTER — Other Ambulatory Visit: Payer: Self-pay

## 2019-05-01 ENCOUNTER — Encounter (HOSPITAL_COMMUNITY): Payer: Self-pay | Admitting: Family Medicine

## 2019-05-01 DIAGNOSIS — H5461 Unqualified visual loss, right eye, normal vision left eye: Secondary | ICD-10-CM | POA: Diagnosis not present

## 2019-05-01 DIAGNOSIS — S20219A Contusion of unspecified front wall of thorax, initial encounter: Secondary | ICD-10-CM

## 2019-05-01 DIAGNOSIS — S139XXA Sprain of joints and ligaments of unspecified parts of neck, initial encounter: Secondary | ICD-10-CM | POA: Diagnosis not present

## 2019-05-01 DIAGNOSIS — H5702 Anisocoria: Secondary | ICD-10-CM

## 2019-05-01 MED ORDER — HYDROCODONE-ACETAMINOPHEN 5-325 MG PO TABS: 1.0000 | ORAL_TABLET | Freq: Four times a day (QID) | ORAL | 0 refills | Status: DC | PRN

## 2019-05-01 MED ORDER — CYCLOBENZAPRINE HCL 5 MG PO TABS: 5.0000 mg | ORAL_TABLET | Freq: Every day | ORAL | 0 refills | Status: DC

## 2019-05-01 NOTE — ED Provider Notes (Addendum)
[facility]    CSN: [id number] Arrival date & time: 05/01/19  1500      History   Chief Complaint Chief Complaint  Patient presents with   Back Pain    HPI Karla Carey is a 43 y.o. female.   This is a 43 year old established  urgent care patient.  She presents for evaluation of injuries suffered in a fall 2 days ago.  She complains of back pain, neck pain, and headache.  The pain is in the thoracic spine.  Patient states that she has been losing her vision in the right eye for the past year.  She still has vision but notes deterioration without pain or scotoma.  Patient was last seen here 1 year ago for dental extraction complications.     Past Medical History:  Diagnosis Date   Asthma    Depression     Patient Active Problem List   Diagnosis Date Noted   Asthma exacerbation 06/13/2018   Depression 06/13/2018   Tobacco abuse 06/13/2018   Community acquired pneumonia 06/13/2018    Past Surgical History:  Procedure Laterality Date   CESAREAN SECTION     CHOLECYSTECTOMY     TUBAL LIGATION      OB History   No obstetric history on file.      Home Medications    Prior to Admission medications   Medication Sig Start Date End Date Taking? Authorizing Provider  cyclobenzaprine (FLEXERIL) 5 MG tablet Take 1 tablet (5 mg total) by mouth at bedtime. 05/01/19   Elvina Sidle, MD  estrogens, conjugated, (PREMARIN) 1.25 MG tablet Take 2 tablets (2.5 mg total) by mouth 2 (two) times daily for 4 days. 06/15/18 06/19/18  Rolly Salter, MD  ferrous sulfate 325 (65 FE) MG tablet Take 1 tablet (325 mg total) by mouth daily with breakfast for 30 days. 06/15/18 07/15/18  Rolly Salter, MD  HYDROcodone-acetaminophen (NORCO) 5-325 MG tablet Take 1 tablet by mouth every 6 (six) hours as needed for moderate pain. 05/01/19   Elvina Sidle, MD  nicotine (NICODERM CQ - DOSED IN MG/24 HOURS) 21 mg/24hr patch Place 1 patch (21 mg total) onto the  skin daily. 06/16/18   Rolly Salter, MD    Family History Family History  Problem Relation Age of Onset   Hypertension Mother    Hyperlipidemia Mother    Hypertension Father     Social History Social History   Tobacco Use   Smoking status: Current Every Day Smoker    Packs/day: 1.00    Types: Cigarettes   Smokeless tobacco: Never Used  Substance Use Topics   Alcohol use: No   Drug use: No     Allergies   Asa buff (mag [buffered aspirin], Peanut butter flavor, and Codeine   Review of Systems Review of Systems   Physical Exam Triage Vital Signs ED Triage Vitals  Enc Vitals Group     BP      Pulse      Resp      Temp      Temp src      SpO2      Weight      Height      Head Circumference      Peak Flow      Pain Score      Pain Loc      Pain Edu?      Excl. in GC?    No data found.  Updated Vital  Signs BP (!) 142/74 (BP Location: Right Arm)    Pulse 80    Temp 98.2 F (36.8 C) (Oral)    Resp 20    LMP 05/01/2019    SpO2 97%    Physical Exam Vitals and nursing note reviewed.  Constitutional:      Appearance: Normal appearance. She is obese. She is not ill-appearing or toxic-appearing.  HENT:     Head: Normocephalic and atraumatic.     Right Ear: Tympanic membrane normal.     Left Ear: Tympanic membrane normal.     Nose: Nose normal.     Mouth/Throat:     Mouth: Mucous membranes are moist.  Eyes:     Extraocular Movements: Extraocular movements intact.     Conjunctiva/sclera: Conjunctivae normal.     Comments: Pupils are unequal with right smaller than left.  Both react  to light even though they are different diameters.  There is no photophobia.  Neck:     Comments: Tender posterior neck spinous processes without STS or ecchymosis.  Patient turns neck slowly but fully.  More pain with neck flexion. Cardiovascular:     Rate and Rhythm: Normal rate and regular rhythm.     Pulses: Normal pulses.  Pulmonary:     Effort: Pulmonary effort  is normal.     Breath sounds: Normal breath sounds.  Musculoskeletal:        General: Tenderness and signs of injury present. No swelling or deformity.     Cervical back: Neck supple. Muscular tenderness present.  Skin:    General: Skin is warm.  Neurological:     General: No focal deficit present.     Mental Status: She is alert.  Psychiatric:        Mood and Affect: Mood normal.        Behavior: Behavior normal.      UC Treatments / Results  Labs (all labs ordered are listed, but only abnormal results are displayed) Labs Reviewed - No data to display  EKG   Radiology Chest x-ray shows no thoracic scoliosis or vertebral abnormalities. Procedures Procedures (including critical care time)  Medications Ordered in UC Medications - No data to display  Initial Impression / Assessment and Plan / UC Course  I have reviewed the triage vital signs and the nursing notes.  Pertinent labs & imaging results that were available during my care of the patient were reviewed by me and considered in my medical decision making (see chart for details).    Final Clinical Impressions(s) / UC Diagnoses   Final diagnoses:  Contusion of chest wall, unspecified laterality, initial encounter  Neck sprain, initial encounter  Vision loss, right eye  Anisocoria     Discharge Instructions     Please call Dr. Katy Fitch for urgent evaluation of vision loss in right eye over the past year.    ED Prescriptions    Medication Sig Dispense Auth. Provider   HYDROcodone-acetaminophen (NORCO) 5-325 MG tablet Take 1 tablet by mouth every 6 (six) hours as needed for moderate pain. 12 tablet Robyn Haber, MD   cyclobenzaprine (FLEXERIL) 5 MG tablet Take 1 tablet (5 mg total) by mouth at bedtime. 7 tablet Robyn Haber, MD     I have reviewed the PDMP during this encounter.   Robyn Haber, MD 05/01/19 9163    Robyn Haber, MD 05/16/19 [phone]

## 2019-05-01 NOTE — Discharge Instructions (Addendum)
Please call Dr. Katy Fitch for urgent evaluation of vision loss in right eye over the past year.

## 2019-05-01 NOTE — ED Triage Notes (Signed)
Pt. Is here with neck/back pain from a fall that happened 2 days ago. She has been self medicating since the fall.

## 2019-06-01 ENCOUNTER — Encounter (HOSPITAL_COMMUNITY): Payer: Self-pay

## 2019-06-01 ENCOUNTER — Other Ambulatory Visit: Payer: Self-pay

## 2019-06-01 ENCOUNTER — Ambulatory Visit (HOSPITAL_COMMUNITY)
Admission: EM | Admit: 2019-06-01 | Discharge: 2019-06-01 | Disposition: A | Discharge status: Home / Self Care | Payer: Medicaid Other | Attending: Family Medicine | Admitting: Family Medicine

## 2019-06-01 DIAGNOSIS — R1031 Right lower quadrant pain: Secondary | ICD-10-CM

## 2019-06-01 DIAGNOSIS — Z3202 Encounter for pregnancy test, result negative: Secondary | ICD-10-CM | POA: Diagnosis not present

## 2019-06-01 LAB — POCT URINALYSIS DIP (DEVICE)
Bilirubin Urine: NEGATIVE
Glucose, UA: NEGATIVE mg/dL
Hgb urine dipstick: NEGATIVE
Ketones, ur: NEGATIVE mg/dL
Leukocytes,Ua: NEGATIVE
Nitrite: NEGATIVE
Protein, ur: NEGATIVE mg/dL
Specific Gravity, Urine: 1.025 (ref 1.005–1.030)
Urobilinogen, UA: 0.2 mg/dL (ref 0.0–1.0)
pH: 5 (ref 5.0–8.0)

## 2019-06-01 LAB — CBC
HCT: 45.5 % (ref 36.0–46.0)
Hemoglobin: 14.2 g/dL (ref 12.0–15.0)
MCH: 27.3 pg (ref 26.0–34.0)
MCHC: 31.2 g/dL (ref 30.0–36.0)
MCV: 87.5 fL (ref 80.0–100.0)
Platelets: 247 10*3/uL (ref 150–400)
RBC: 5.2 MIL/uL — ABNORMAL HIGH (ref 3.87–5.11)
RDW: 16.8 % — ABNORMAL HIGH (ref 11.5–15.5)
WBC: 9.9 10*3/uL (ref 4.0–10.5)
nRBC: 0 % (ref 0.0–0.2)

## 2019-06-01 LAB — BASIC METABOLIC PANEL
Anion gap: 9 (ref 5–15)
BUN: 8 mg/dL (ref 6–20)
CO2: 23 mmol/L (ref 22–32)
Calcium: 10 mg/dL (ref 8.9–10.3)
Chloride: 103 mmol/L (ref 98–111)
Creatinine, Ser: 0.78 mg/dL (ref 0.44–1.00)
GFR calc Af Amer: >60 mL/min (ref >60)
GFR calc non Af Amer: >60 mL/min (ref >60)
Glucose, Bld: 90 mg/dL (ref 70–99)
Potassium: 4.4 mmol/L (ref 3.5–5.1)
Sodium: 135 mmol/L (ref 135–145)

## 2019-06-01 LAB — POC URINE PREG, ED
Preg Test, Ur: NEGATIVE
Preg Test, Ur: NEGATIVE

## 2019-06-01 LAB — POCT PREGNANCY, URINE: Preg Test, Ur: NEGATIVE

## 2019-06-01 LAB — C-REACTIVE PROTEIN: CRP: 0.5 mg/dL (ref <1.0)

## 2019-06-01 LAB — SEDIMENTATION RATE: Sed Rate: 5 mm/hr (ref 0–22)

## 2019-06-01 MED ORDER — HYDROCODONE-ACETAMINOPHEN 5-325 MG PO TABS: 2.0000 | ORAL_TABLET | Freq: Four times a day (QID) | ORAL | 0 refills | Status: DC | PRN

## 2019-06-01 NOTE — ED Triage Notes (Signed)
Pt c/o RLQ abd pain and nausea x2 days; denies vomiting/diarrhea, fever, chills, or dysuria sx. States no period for 6 months; denies vag discharge. Denies URI sx.

## 2019-06-01 NOTE — Discharge Instructions (Addendum)
Please seek immediate care at the emergency department if your pain becomes worse  We will call with the results from today  Please try the pain medicine as needed

## 2019-06-01 NOTE — ED Provider Notes (Signed)
[facility]    CSN: [id number] Arrival date & time: 06/01/19  1559      History   Chief Complaint Chief Complaint  Patient presents with  . Abdominal Pain    HPI Karla Carey is a 44 y.o. female.   She is presenting with acute onset of right lower quadrant abdominal pain.  The pain is sharp and severe.  It is localized to the lower quadrant.  She had a CT done in 2017 that showed no appendix in this area.  She did show a phlegmon with no drainable abscess at that time.  She has had a cholecystectomy.  Denies constipation with regular bowel movements.  Has had her tubes tied.  No other surgeries.  Pain is been occurring for 2 days.  Does not seem to be associated with food.    HPI  Past Medical History:  Diagnosis Date  . Asthma   . Depression     Patient Active Problem List   Diagnosis Date Noted  . Asthma exacerbation 06/13/2018  . Depression 06/13/2018  . Tobacco abuse 06/13/2018  . Community acquired pneumonia 06/13/2018    Past Surgical History:  Procedure Laterality Date  . CESAREAN SECTION    . CHOLECYSTECTOMY    . TUBAL LIGATION      OB History   No obstetric history on file.      Home Medications    Prior to Admission medications   Medication Sig Start Date End Date Taking? Authorizing Provider  cyclobenzaprine (FLEXERIL) 5 MG tablet Take 1 tablet (5 mg total) by mouth at bedtime. 05/01/19   Robyn Haber, MD  estrogens, conjugated, (PREMARIN) 1.25 MG tablet Take 2 tablets (2.5 mg total) by mouth 2 (two) times daily for 4 days. 06/15/18 06/19/18  Lavina Hamman, MD  ferrous sulfate 325 (65 FE) MG tablet Take 1 tablet (325 mg total) by mouth daily with breakfast for 30 days. 06/15/18 07/15/18  Lavina Hamman, MD  HYDROcodone-acetaminophen (NORCO/VICODIN) 5-325 MG tablet Take 2 tablets by mouth every 6 (six) hours as needed. 06/01/19   Rosemarie Ax, MD  nicotine (NICODERM CQ - DOSED IN MG/24 HOURS) 21 mg/24hr patch Place 1 patch (21 mg  total) onto the skin daily. 06/16/18   Lavina Hamman, MD    Family History Family History  Problem Relation Age of Onset  . Hypertension Mother   . Hyperlipidemia Mother   . Hypertension Father     Social History Social History   Tobacco Use  . Smoking status: Current Every Day Smoker    Packs/day: 1.00    Types: Cigarettes  . Smokeless tobacco: Never Used  Substance Use Topics  . Alcohol use: No  . Drug use: No     Allergies   Asa buff (mag [buffered aspirin], Peanut butter flavor, and Codeine   Review of Systems Review of Systems See HPI   Physical Exam Triage Vital Signs ED Triage Vitals  Enc Vitals Group     BP 06/01/19 1726 (!) 140/103     Pulse Rate 06/01/19 1726 75     Resp 06/01/19 1726 18     Temp 06/01/19 1726 97.6 F (36.4 C)     Temp Source 06/01/19 1726 Oral     SpO2 06/01/19 1726 100 %     Weight --      Height --      Head Circumference --      Peak Flow --  Pain Score 06/01/19 1723 8     Pain Loc --      Pain Edu? --      Excl. in GC? --    No data found.  Updated Vital Signs BP (!) 140/103 (BP Location: Right Arm)   Pulse 75   Temp 97.6 F (36.4 C) (Oral)   Resp 18   LMP  (LMP Unknown) Comment: h/o tubal ligation.  SpO2 100%   Visual Acuity Right Eye Distance:   Left Eye Distance:   Bilateral Distance:    Right Eye Near:   Left Eye Near:    Bilateral Near:     Physical Exam Gen: NAD, alert, cooperative with exam,  ENT: normal lips, normal nasal mucosa,  Eye: normal EOM, normal conjunctiva and lids CV:  no edema, +2 pedal pulses   Resp: no accessory muscle use, non-labored,  GI: no masses, tenderness in the right lower quadrant, soft, nondistended, positive bowel sounds, no hernia  Skin: no rashes, no areas of induration  Neuro: normal tone, normal sensation to touch Psych:  normal insight, alert and oriented MSK: pain with hip flexion in the RLQ, normal IR and ER   UC Treatments / Results  Labs (all labs  ordered are listed, but only abnormal results are displayed) Labs Reviewed  CBC  BASIC METABOLIC PANEL  SEDIMENTATION RATE  C-REACTIVE PROTEIN  POCT PREGNANCY, URINE  POC URINE PREG, ED  POCT URINALYSIS DIP (DEVICE)  POC URINE PREG, ED    EKG   Radiology No results found.  Procedures Procedures (including critical care time)  Medications Ordered in UC Medications - No data to display  Initial Impression / Assessment and Plan / UC Course  I have reviewed the triage vital signs and the nursing notes.  Pertinent labs & imaging results that were available during my care of the patient were reviewed by me and considered in my medical decision making (see chart for details).     Karla Carey is a 45 year old female is presenting with right lower quadrant abdominal pain.  She had a CT performed in 2017 that did not show the appendix.  Does not seem to be associated with nephrolithiasis based on urinalysis.  Negative pregnancy test.  May have an abscess in this area that is causing the intermittent pain.  Will obtain CBC, BMP and inflammatory markers.  Given indications to seek immediate care.  Will provide Norco for severe pain.  Given occasions follow-up return.  Final Clinical Impressions(s) / UC Diagnoses   Final diagnoses:  Right lower quadrant abdominal pain     Discharge Instructions     Please seek immediate care at the emergency department if your pain becomes worse  We will call with the results from today  Please try the pain medicine as needed     ED Prescriptions    Medication Sig Dispense Auth. Provider   HYDROcodone-acetaminophen (NORCO/VICODIN) 5-325 MG tablet Take 2 tablets by mouth every 6 (six) hours as needed. 10 tablet Myra Rude, MD     I have reviewed the PDMP during this encounter.   Myra Rude, MD 06/01/19 [phone]

## 2019-06-02 ENCOUNTER — Telehealth: Payer: Self-pay | Admitting: Family Medicine

## 2019-06-02 NOTE — Telephone Encounter (Signed)
Left VM for patient. If she calls back please have her speak with a nurse/CMA and inform that her labs shows no signs of inflammation.   If any questions then please take the best time and phone number to call and I will try to call her back.   Myra Rude, MD [facility] 06/02/2019, 3:08 PM

## 2019-06-04 ENCOUNTER — Telehealth: Payer: Self-pay | Admitting: Family Medicine

## 2019-06-04 NOTE — Telephone Encounter (Signed)
Left VM for patient. If she calls back please have her speak with a nurse/CMA and inform that her labs do not show infection.   If any questions then please take the best time and phone number to call and I will try to call her back.   Myra Rude, MD [facility] 06/04/2019, 9:54 AM

## 2019-06-06 ENCOUNTER — Telehealth (HOSPITAL_COMMUNITY): Payer: Self-pay | Admitting: *Deleted

## 2019-09-17 ENCOUNTER — Ambulatory Visit (INDEPENDENT_AMBULATORY_CARE_PROVIDER_SITE_OTHER): Payer: Medicaid Other

## 2019-09-17 ENCOUNTER — Ambulatory Visit (HOSPITAL_COMMUNITY)
Admission: EM | Admit: 2019-09-17 | Discharge: 2019-09-17 | Disposition: A | Discharge status: Home / Self Care | Payer: Medicaid Other | Attending: Family Medicine | Admitting: Family Medicine

## 2019-09-17 ENCOUNTER — Encounter (HOSPITAL_COMMUNITY): Payer: Self-pay

## 2019-09-17 ENCOUNTER — Other Ambulatory Visit: Payer: Self-pay

## 2019-09-17 DIAGNOSIS — M79631 Pain in right forearm: Secondary | ICD-10-CM | POA: Diagnosis not present

## 2019-09-17 DIAGNOSIS — M778 Other enthesopathies, not elsewhere classified: Secondary | ICD-10-CM

## 2019-09-17 DIAGNOSIS — M79601 Pain in right arm: Secondary | ICD-10-CM | POA: Diagnosis not present

## 2019-09-17 MED ORDER — PREDNISONE 10 MG (21) PO TBPK: ORAL_TABLET | Freq: Every day | ORAL | 0 refills | Status: AC

## 2019-09-17 NOTE — Discharge Instructions (Addendum)
Take the ibuprofen as prescribed.  Rest and elevate your arm.  Apply ice packs 2-3 times a day for up to 20 minutes each.    Follow up with your primary care provider or an orthopedist if you symptoms continue or worsen;  Or if you develop new symptoms, such as numbness, tingling, or weakness.    I have attached information for orthopedics for you to call if things are not improving.   Your xray is negative today.

## 2019-09-17 NOTE — ED Triage Notes (Signed)
Pt states she has right arm pain. Pt states it been this way for a week or more.

## 2019-09-17 NOTE — ED Provider Notes (Signed)
[facility]    CSN: [id number] Arrival date & time: 09/17/19  1300      History   Chief Complaint Chief Complaint  Patient presents with  . Arm Pain    HPI Karla Carey is a 44 y.o. female.   Patient reports that she has been experiencing right forearm pain for the last week.  Reports that she has history of tendinitis.  Reports that she does not have a brace at home.  Reports that she is taken Tylenol with temporary relief.  Reports that she was peeling potatoes the other day and was having issue holding the potatoes and the peeler.  Reports that the pain and swelling was worse after that episode.  Denies headache, cough, shortness of breath, nausea, vomiting, diarrhea, rash, fever, other symptoms. Patient has medical history significant for smoking, depression, asthma.  ROS per HPI   The history is provided by the patient.    Past Medical History:  Diagnosis Date  . Asthma   . Depression     Patient Active Problem List   Diagnosis Date Noted  . Asthma exacerbation 06/13/2018  . Depression 06/13/2018  . Tobacco abuse 06/13/2018  . Community acquired pneumonia 06/13/2018    Past Surgical History:  Procedure Laterality Date  . CESAREAN SECTION    . CHOLECYSTECTOMY    . TUBAL LIGATION      OB History   No obstetric history on file.      Home Medications    Prior to Admission medications   Medication Sig Start Date End Date Taking? Authorizing Provider  cyclobenzaprine (FLEXERIL) 5 MG tablet Take 1 tablet (5 mg total) by mouth at bedtime. Patient not taking: Reported on 09/17/2019 05/01/19   Elvina Sidle, MD  estrogens, conjugated, (PREMARIN) 1.25 MG tablet Take 2 tablets (2.5 mg total) by mouth 2 (two) times daily for 4 days. Patient not taking: Reported on 09/17/2019 06/15/18 06/19/18  Rolly Salter, MD  ferrous sulfate 325 (65 FE) MG tablet Take 1 tablet (325 mg total) by mouth daily with breakfast for 30 days. Patient not taking: Reported  on 09/17/2019 06/15/18 07/15/18  Rolly Salter, MD  HYDROcodone-acetaminophen (NORCO/VICODIN) 5-325 MG tablet Take 2 tablets by mouth every 6 (six) hours as needed. 06/01/19   Myra Rude, MD  nicotine (NICODERM CQ - DOSED IN MG/24 HOURS) 21 mg/24hr patch Place 1 patch (21 mg total) onto the skin daily. Patient not taking: Reported on 09/17/2019 06/16/18   Rolly Salter, MD  predniSONE (STERAPRED UNI-PAK 21 TAB) 10 MG (21) TBPK tablet Take by mouth daily for 6 days. Take 6 tablets on day 1, 5 tablets on day 2, 4 tablets on day 3, 3 tablets on day 4, 2 tablets on day 5, 1 tablet on day 6 09/17/19 09/23/19  Moshe Cipro, NP    Family History Family History  Problem Relation Age of Onset  . Hypertension Mother   . Hyperlipidemia Mother   . Hypertension Father     Social History Social History   Tobacco Use  . Smoking status: Current Every Day Smoker    Packs/day: 1.00    Types: Cigarettes  . Smokeless tobacco: Never Used  Substance Use Topics  . Alcohol use: No  . Drug use: No     Allergies   Asa buff (mag [buffered aspirin], Peanut butter flavor, and Codeine   Review of Systems Review of Systems   Physical Exam Triage Vital Signs ED Triage Vitals  Enc  Vitals Group     BP 09/17/19 1329 115/63     Pulse Rate 09/17/19 1329 77     Resp 09/17/19 1329 18     Temp 09/17/19 1329 98.2 F (36.8 C)     Temp Source 09/17/19 1329 Oral     SpO2 09/17/19 1329 98 %     Weight 09/17/19 1326 248 lb (112.5 kg)     Height --      Head Circumference --      Peak Flow --      Pain Score 09/17/19 1326 8     Pain Loc --      Pain Edu? --      Excl. in GC? --    No data found.  Updated Vital Signs BP 115/63 (BP Location: Right Arm)   Pulse 77   Temp 98.2 F (36.8 C) (Oral)   Resp 18   Wt 248 lb (112.5 kg)   SpO2 98%   BMI 50.09 kg/m   Visual Acuity Right Eye Distance:   Left Eye Distance:   Bilateral Distance:    Right Eye Near:   Left Eye Near:    Bilateral  Near:     Physical Exam Vitals and nursing note reviewed.  Constitutional:      General: She is not in acute distress.    Appearance: She is well-developed. She is obese. She is not ill-appearing.  HENT:     Head: Normocephalic and atraumatic.  Eyes:     Extraocular Movements: Extraocular movements intact.     Conjunctiva/sclera: Conjunctivae normal.     Pupils: Pupils are equal, round, and reactive to light.  Cardiovascular:     Rate and Rhythm: Normal rate and regular rhythm.     Heart sounds: Normal heart sounds. No murmur.  Pulmonary:     Effort: Pulmonary effort is normal. No respiratory distress.     Breath sounds: Normal breath sounds. No stridor. No wheezing, rhonchi or rales.  Chest:     Chest wall: No tenderness.  Abdominal:     General: Bowel sounds are normal.     Palpations: Abdomen is soft.     Tenderness: There is no abdominal tenderness.  Musculoskeletal:        General: Swelling and tenderness present.     Cervical back: Normal range of motion and neck supple.     Comments: Swelling to right wrist.  Tenderness to palpation to ulnar styloid, as well as medial epicondyle.  Skin:    General: Skin is warm and dry.     Capillary Refill: Capillary refill takes less than 2 seconds.  Neurological:     General: No focal deficit present.     Mental Status: She is alert and oriented to person, place, and time.  Psychiatric:        Mood and Affect: Mood normal.        Behavior: Behavior normal.      UC Treatments / Results  Labs (all labs ordered are listed, but only abnormal results are displayed) Labs Reviewed - No data to display  EKG   Radiology DG Forearm Right  Result Date: 09/17/2019 CLINICAL DATA:  Right forearm pain for 1 week.  No known injury. EXAM: RIGHT FOREARM - 2 VIEW COMPARISON:  None. FINDINGS: There is no evidence of fracture or other focal bone lesions. Soft tissues are unremarkable. IMPRESSION: Negative exam. Electronically Signed   By:  Drusilla Kanner M.D.   On: 09/17/2019 14:51  Procedures Procedures (including critical care time)  Medications Ordered in UC Medications - No data to display  Initial Impression / Assessment and Plan / UC Course  I have reviewed the triage vital signs and the nursing notes.  Pertinent labs & imaging results that were available during my care of the patient were reviewed by me and considered in my medical decision making (see chart for details).     Right arm pain: Presents with right arm pain for the last week.  Has history of tendinitis in the right wrist.  Does not have a brace for this.  Patient is allergic to aspirin and does not take NSAIDs.  X-ray today negative.  Entire right forearm mildly edematous, very tender to ulnar styloid and medial epicondyle.  Discussed limitations of x-ray with patient.  Instructed that patient follow-up with [facility] if her issues are not improving with steroids.  Prescribed prednisone taper.  Discussed with patient how to take this medication.  Take 6 tablets on day 1, 5 tablets on day 2, 4 tablets on day 3, 3 tablets on day 4, 2 tablets on day 5, 1 tablet on day 6.  Also got right wrist splint applied to patient before she left.  Instructed to wear this when she is being active when she is sleeping.  Instructed patient that she may take Tylenol as needed for pain.  Instructed she may follow-up with primary care with this office as needed.  Instructed to go to the ER for loss of sensation, loss of strength, other concerning symptoms.  Patient verbalized understanding is in agreement with treatment plan. Final Clinical Impressions(s) / UC Diagnoses   Final diagnoses:  Right arm pain  Tendonitis of wrist, right     Discharge Instructions     Take the ibuprofen as prescribed.  Rest and elevate your arm.  Apply ice packs 2-3 times a day for up to 20 minutes each.    Follow up with your primary care provider or an orthopedist if you  symptoms continue or worsen;  Or if you develop new symptoms, such as numbness, tingling, or weakness.    I have attached information for orthopedics for you to call if things are not improving.   Your xray is negative today.    ED Prescriptions    Medication Sig Dispense Auth. Provider   predniSONE (STERAPRED UNI-PAK 21 TAB) 10 MG (21) TBPK tablet Take by mouth daily for 6 days. Take 6 tablets on day 1, 5 tablets on day 2, 4 tablets on day 3, 3 tablets on day 4, 2 tablets on day 5, 1 tablet on day 6 21 tablet Faustino Congress, NP     PDMP not reviewed this encounter.   Faustino Congress, NP 09/17/19 1512

## 2019-10-17 ENCOUNTER — Other Ambulatory Visit: Payer: Self-pay

## 2019-10-17 ENCOUNTER — Ambulatory Visit (HOSPITAL_COMMUNITY)
Admission: EM | Admit: 2019-10-17 | Discharge: 2019-10-17 | Disposition: A | Discharge status: Home / Self Care | Payer: Medicaid Other | Attending: Family Medicine | Admitting: Family Medicine

## 2019-10-17 ENCOUNTER — Encounter (HOSPITAL_COMMUNITY): Payer: Self-pay

## 2019-10-17 DIAGNOSIS — M79671 Pain in right foot: Secondary | ICD-10-CM

## 2019-10-17 NOTE — ED Triage Notes (Signed)
Pt states she has right foot pain x 1 week. Pt states she has knots on the top of her foot as well.

## 2019-10-19 NOTE — ED Provider Notes (Signed)
[facility]   [id number] 10/17/19 Arrival Time: 9833  ASSESSMENT & PLAN:  1. Right foot pain     No indication for imaging at this time. Discussed. Likely foot strain.  Recommend supportive shoe. She reports that she is unable to take NSAIDS secondary to ASA allergy. Declines trial of prednisone. WBAT. She voices worry re: possible DVT. Reassured that I have no suspicion of DVT.  Recommend: Follow-up Information    Karla Carey.   Specialty: Urgent Care Why: If worsening or failing to improve as anticipated. Contact information: [address] [place] [phone]           Reviewed expectations re: course of current medical issues. Questions answered. Outlined signs and symptoms indicating need for more acute intervention. Patient verbalized understanding. After Visit Summary given.  SUBJECTIVE: History from: patient. Karla Carey is a 44 y.o. female who reports intermittent moderate pain of her right dorsal foot; described as aching; without radiation. Onset: gradual. First noted: over this past week. Injury/trama: no. Symptoms have progressed to a point and plateaued since beginning. Aggravating factors: certain movements and prolonged walking/standing. Alleviating factors: rest. Associated symptoms: none reported. Extremity sensation changes or weakness: none. Self treatment: has not tried OTC therapies.  History of similar: no.  Past Surgical History:  Procedure Laterality Date  . CESAREAN SECTION    . CHOLECYSTECTOMY    . TUBAL LIGATION        OBJECTIVE:  Vitals:   10/17/19 1607 10/17/19 1609  BP:  129/77  Pulse:  86  Resp:  18  Temp:  98 F (36.7 C)  TempSrc:  Oral  SpO2:  100%  Weight: 113.4 kg     General appearance: alert; no distress HEENT: [place]; AT Neck: supple with FROM Resp: unlabored respirations Extremities: . RLE: warm with well perfused appearance; poorly  localized mild to moderate tenderness over right dorsal foot; without gross deformities; swelling: none; bruising: none; ankle ROM: normal CV: brisk extremity capillary refill of RLE; 2+ DP pulse and PT pulse of RLE. Skin: warm and dry; no visible rashes Neurologic: gait normal; normal sensation and strength of RLE Psychological: alert and cooperative; normal mood and affect  Imaging: No results found.    Allergies  Allergen Reactions  . Asa Buff (Mag [Buffered Aspirin] Anaphylaxis  . Peanut Butter Flavor Anaphylaxis  . Codeine Other (See Comments)    Bad headache    Past Medical History:  Diagnosis Date  . Asthma   . Depression    Social History   Socioeconomic History  . Marital status: Legally Separated    Spouse name: Not on file  . Number of children: Not on file  . Years of education: Not on file  . Highest education level: Not on file  Occupational History  . Not on file  Tobacco Use  . Smoking status: Current Every Day Smoker    Packs/day: 1.00    Types: Cigarettes  . Smokeless tobacco: Never Used  Substance and Sexual Activity  . Alcohol use: No  . Drug use: No  . Sexual activity: Yes    Birth control/protection: Surgical  Other Topics Concern  . Not on file  Social History Narrative  . Not on file   Social Determinants of Health   Financial Resource Strain:   . Difficulty of Paying Living Expenses:   Food Insecurity:   . Worried About Charity fundraiser in the Last Year:   .  Ran Out of Food in the Last Year:   Transportation Needs:   . Freight forwarder (Medical):   Marland Kitchen Lack of Transportation (Non-Medical):   Physical Activity:   . Days of Exercise per Week:   . Minutes of Exercise per Session:   Stress:   . Feeling of Stress :   Social Connections:   . Frequency of Communication with Friends and Family:   . Frequency of Social Gatherings with Friends and Family:   . Attends Religious Services:   . Active Member of Clubs or  Organizations:   . Attends Banker Meetings:   Marland Kitchen Marital Status:    Family History  Problem Relation Age of Onset  . Hypertension Mother   . Hyperlipidemia Mother   . Hypertension Father    Past Surgical History:  Procedure Laterality Date  . CESAREAN SECTION    . CHOLECYSTECTOMY    . TUBAL LIGATION        Mardella Layman, MD 10/19/19 [phone]

## 2020-01-28 ENCOUNTER — Ambulatory Visit (HOSPITAL_COMMUNITY)
Admission: EM | Admit: 2020-01-28 | Discharge: 2020-01-28 | Disposition: A | Discharge status: Home / Self Care | Payer: Medicaid Other | Attending: Family Medicine | Admitting: Family Medicine

## 2020-01-28 ENCOUNTER — Encounter (HOSPITAL_COMMUNITY): Payer: Self-pay | Admitting: Emergency Medicine

## 2020-01-28 ENCOUNTER — Other Ambulatory Visit: Payer: Self-pay

## 2020-01-28 DIAGNOSIS — L0291 Cutaneous abscess, unspecified: Secondary | ICD-10-CM | POA: Diagnosis not present

## 2020-01-28 MED ORDER — DOXYCYCLINE HYCLATE 100 MG PO CAPS: 100.0000 mg | ORAL_CAPSULE | Freq: Two times a day (BID) | ORAL | 0 refills | Status: DC

## 2020-01-28 NOTE — ED Provider Notes (Signed)
[facility]   [id number] 01/28/20 Arrival Time: 5956  ASSESSMENT & PLAN:  1. Abscess     Incision and Drainage Procedure Note  Anesthesia: 1% plain lidocaine  Procedure Details  The procedure, risks and complications have been discussed in detail (including, but not limited to pain and bleeding) with the patient.  The skin induration was prepped and draped in the usual fashion. After adequate local anesthesia, I&D with a #11 blade was performed on the left posterior thigh with copious, bloody, purulent drainage.  EBL: minimal Drains: none Packing: none Condition: Tolerated procedure well Complications: none.  Meds ordered this encounter  Medications   doxycycline (VIBRAMYCIN) 100 MG capsule    Sig: Take 1 capsule (100 mg total) by mouth 2 (two) times daily.    Dispense:  14 capsule    Refill:  0    Wound care instructions discussed and given in written format. To return in 48 hours for wound check if needed.  Finish all antibiotics. OTC analgesics as needed.  Reviewed expectations re: course of current medical issues. Questions answered. Outlined signs and symptoms indicating need for more acute intervention. Patient verbalized understanding. After Visit Summary given.   SUBJECTIVE:  Karla Carey is a 44 y.o. female who presents with a possible infection of her left posterior thigh. Onset gradual, first noted this past week; without active drainage and without active bleeding. Area painful. Symptoms have gradually worsened since beginning. Fever: absent. OTC/home treatment: none reported.   OBJECTIVE:  Vitals:   01/28/20 1123  BP: 128/86  Pulse: 77  Resp: 16  Temp: 97.8 F (36.6 C)  TempSrc: Oral  SpO2: 98%     General appearance: alert; no distress Skin: approx 2 x 2 cm induration of her left posterior thigh; tender to touch; no active drainage or bleeding; overlying erythema Psychological: alert and cooperative; normal mood and  affect  Allergies  Allergen Reactions   Asa Buff (Mag [Buffered Aspirin] Anaphylaxis   Peanut Butter Flavor Anaphylaxis   Codeine Other (See Comments)    Bad headache    Past Medical History:  Diagnosis Date   Asthma    Depression    Social History   Socioeconomic History   Marital status: Legally Separated    Spouse name: Not on file   Number of children: Not on file   Years of education: Not on file   Highest education level: Not on file  Occupational History   Not on file  Tobacco Use   Smoking status: Current Every Day Smoker    Packs/day: 1.00    Types: Cigarettes   Smokeless tobacco: Never Used  Substance and Sexual Activity   Alcohol use: No   Drug use: No   Sexual activity: Yes    Birth control/protection: Surgical  Other Topics Concern   Not on file  Social History Narrative   Not on file   Social Determinants of Health   Financial Resource Strain:    Difficulty of Paying Living Expenses: Not on file  Food Insecurity:    Worried About Radiation protection practitioner of Food in the Last Year: Not on file   [employer] of Food in the Last Year: Not on file  Transportation Needs:    Lack of Transportation (Medical): Not on file   Lack of Transportation (Non-Medical): Not on file  Physical Activity:    Days of Exercise per Week: Not on file   Minutes of Exercise per Session: Not on file  Stress:  Feeling of Stress : Not on file  Social Connections:    Frequency of Communication with Friends and Family: Not on file   Frequency of Social Gatherings with Friends and Family: Not on file   Attends Religious Services: Not on file   Active Member of Clubs or Organizations: Not on file   Attends Banker Meetings: Not on file   Marital Status: Not on file   Family History  Problem Relation Age of Onset   Hypertension Mother    Hyperlipidemia Mother    Hypertension Father    Past Surgical History:  Procedure Laterality Date    CESAREAN SECTION     CHOLECYSTECTOMY     TUBAL LIGATION             Mardella Layman, MD 01/28/20 [phone]

## 2020-01-28 NOTE — ED Triage Notes (Signed)
PT reports an abscess behind left leg for 1 week. She has not been able to get it to drain.

## 2020-01-28 NOTE — ED Notes (Signed)
Dressing applied to I&D site. Supplies given for home usage. Instructions given for return precautions.

## 2020-01-28 NOTE — Discharge Instructions (Signed)
If not allergic, you may use over the counter ibuprofen or acetaminophen as needed. ° °

## 2020-03-01 ENCOUNTER — Ambulatory Visit (HOSPITAL_COMMUNITY)
Admission: EM | Admit: 2020-03-01 | Discharge: 2020-03-01 | Disposition: A | Discharge status: Home / Self Care | Payer: Medicaid Other | Attending: Urgent Care | Admitting: Urgent Care

## 2020-03-01 ENCOUNTER — Encounter (HOSPITAL_COMMUNITY): Payer: Self-pay | Admitting: *Deleted

## 2020-03-01 ENCOUNTER — Ambulatory Visit (INDEPENDENT_AMBULATORY_CARE_PROVIDER_SITE_OTHER): Payer: Medicaid Other

## 2020-03-01 ENCOUNTER — Other Ambulatory Visit: Payer: Self-pay

## 2020-03-01 DIAGNOSIS — M545 Low back pain, unspecified: Secondary | ICD-10-CM | POA: Diagnosis not present

## 2020-03-01 DIAGNOSIS — M5416 Radiculopathy, lumbar region: Secondary | ICD-10-CM

## 2020-03-01 DIAGNOSIS — R531 Weakness: Secondary | ICD-10-CM

## 2020-03-01 MED ORDER — NAPROXEN 500 MG PO TABS: 500.0000 mg | ORAL_TABLET | Freq: Two times a day (BID) | ORAL | 0 refills | Status: DC

## 2020-03-01 MED ORDER — TIZANIDINE HCL 4 MG PO TABS: 4.0000 mg | ORAL_TABLET | Freq: Three times a day (TID) | ORAL | 0 refills | Status: DC | PRN

## 2020-03-01 NOTE — ED Triage Notes (Signed)
Patient in with lower back and right leg pain x 1 week. Patient states pain is increasing. Patient states last Friday she fell while walking in the store and states that she could not feel her legs. Patient states she has taken Tylenol at home.

## 2020-03-01 NOTE — ED Provider Notes (Signed)
[facility]   MRN: [id number] DOB: 05-07-1976  Subjective:   Karla Carey is a 44 y.o. female presenting for 1 week history of persistent bilateral low back pain that radiates into both legs and into her toes.  Has also had some weakness in her legs.  States that she suffered a fall from this last week.  Has been using Tylenol for her symptoms.  Denies any prior traumas before her back pain started.  Denies inability to use her legs, walking.  Denies incontinence, change in bowel habits.   Allergies  Allergen Reactions  . Peanut Butter Flavor Anaphylaxis  . Codeine Other (See Comments)    Bad headache    Past Medical History:  Diagnosis Date  . Asthma   . Depression      Past Surgical History:  Procedure Laterality Date  . CESAREAN SECTION    . CHOLECYSTECTOMY    . TUBAL LIGATION      Family History  Problem Relation Age of Onset  . Hypertension Mother   . Hyperlipidemia Mother   . Hypertension Father     Social History   Tobacco Use  . Smoking status: Current Every Day Smoker    Packs/day: 1.00    Types: Cigarettes  . Smokeless tobacco: Never Used  Vaping Use  . Vaping Use: Never used  Substance Use Topics  . Alcohol use: No  . Drug use: No    ROS   Objective:   Vitals: BP 130/80 (BP Location: Right Arm)   Pulse 80   Temp 98 F (36.7 C) (Oral)   Resp 20   LMP 07/03/2019   SpO2 97%   Physical Exam Constitutional:      General: She is not in acute distress.    Appearance: Normal appearance. She is well-developed. She is obese. She is not ill-appearing, toxic-appearing or diaphoretic.  HENT:     Head: Normocephalic and atraumatic.     Nose: Nose normal.     Mouth/Throat:     Mouth: Mucous membranes are moist.     Pharynx: Oropharynx is clear.  Eyes:     General: No scleral icterus.       Right eye: No discharge.        Left eye: No discharge.     Extraocular Movements: Extraocular movements intact.      Conjunctiva/sclera: Conjunctivae normal.     Pupils: Pupils are equal, round, and reactive to light.  Cardiovascular:     Rate and Rhythm: Normal rate.  Pulmonary:     Effort: Pulmonary effort is normal.  Musculoskeletal:     Lumbar back: Tenderness (bilateral) present. No swelling, edema, deformity, signs of trauma, lacerations, spasms or bony tenderness. Decreased range of motion. Negative right straight leg raise test and negative left straight leg raise test. No scoliosis.  Skin:    General: Skin is warm and dry.  Neurological:     General: No focal deficit present.     Mental Status: She is alert and oriented to person, place, and time.     Motor: No weakness.     Coordination: Coordination normal.     Gait: Gait normal.     Deep Tendon Reflexes: Reflexes normal.  Psychiatric:        Mood and Affect: Mood normal.        Behavior: Behavior normal.        Thought Content: Thought content normal.        Judgment: Judgment  normal.     DG Lumbar Spine Complete  Result Date: 03/01/2020 CLINICAL DATA:  Low back pain for 1 week. EXAM: LUMBAR SPINE - COMPLETE 4+ VIEW COMPARISON:  None. FINDINGS: Normal alignment of the lumbar vertebral bodies. Disc spaces and vertebral bodies are maintained. No significant degenerative changes. Small marginal osteophytes are noted. The facets are normally aligned. No pars defects. The visualized bony pelvis is intact. The SI joints are normal. IMPRESSION: Normal alignment and no acute bony findings or significant degenerative changes. Electronically Signed   By: Rudie Meyer M.D.   On: 03/01/2020 12:55     Assessment and Plan :   PDMP not reviewed this encounter.  1. Acute bilateral low back pain without sciatica   2. Lumbar radiculopathy   3. Weakness     Recommended conservative management with naproxen, tizanidine. Also advised [facility] up with [facility] given her recent weakness in her legs leading to a fall. Counseled patient on  potential for adverse effects with medications prescribed/recommended today, ER and return-to-clinic precautions discussed, patient verbalized understanding.    Wallis Bamberg, PA-C 03/01/20 1327

## 2020-07-26 ENCOUNTER — Ambulatory Visit (HOSPITAL_COMMUNITY)
Admission: EM | Admit: 2020-07-26 | Discharge: 2020-07-26 | Disposition: A | Discharge status: Home / Self Care | Payer: Medicaid Other | Attending: Family Medicine | Admitting: Family Medicine

## 2020-07-26 ENCOUNTER — Encounter (HOSPITAL_COMMUNITY): Payer: Self-pay | Admitting: Emergency Medicine

## 2020-07-26 ENCOUNTER — Other Ambulatory Visit: Payer: Self-pay

## 2020-07-26 DIAGNOSIS — K2900 Acute gastritis without bleeding: Secondary | ICD-10-CM | POA: Diagnosis present

## 2020-07-26 DIAGNOSIS — B349 Viral infection, unspecified: Secondary | ICD-10-CM

## 2020-07-26 DIAGNOSIS — F17210 Nicotine dependence, cigarettes, uncomplicated: Secondary | ICD-10-CM | POA: Insufficient documentation

## 2020-07-26 DIAGNOSIS — R110 Nausea: Secondary | ICD-10-CM

## 2020-07-26 DIAGNOSIS — Z8616 Personal history of COVID-19: Secondary | ICD-10-CM | POA: Insufficient documentation

## 2020-07-26 DIAGNOSIS — R197 Diarrhea, unspecified: Secondary | ICD-10-CM | POA: Diagnosis present

## 2020-07-26 DIAGNOSIS — Z20822 Contact with and (suspected) exposure to covid-19: Secondary | ICD-10-CM | POA: Diagnosis not present

## 2020-07-26 MED ORDER — ONDANSETRON HCL 4 MG PO TABS: 4.0000 mg | ORAL_TABLET | Freq: Four times a day (QID) | ORAL | 0 refills | Status: DC

## 2020-07-26 MED ORDER — OMEPRAZOLE 20 MG PO CPDR: 20.0000 mg | DELAYED_RELEASE_CAPSULE | Freq: Every day | ORAL | 0 refills | Status: DC

## 2020-07-26 NOTE — ED Triage Notes (Addendum)
Patient has abdominal pain, nausea and vomiting that started last .  Minimal diarrhea today around 5 am, no more since then.  vomited once today.  Denies urinary symptoms

## 2020-07-26 NOTE — ED Provider Notes (Signed)
[facility]   [id number] 07/26/20 Arrival Time: 1106  CC: ABDOMINAL PAIN  SUBJECTIVE:  Karla Carey is a 45 y.o. female who presents with abdominal pain, nausea and vomiting since this morning. Reports one episode of diarrhea this morning. Has not attempted OTC treatment. Denies a precipitating event, trauma, close contacts with similar symptoms, recent travel or antibiotic use. Denies abdominal cramping. Has not taken OTC medications for this. Denies alleviating or aggravating factors. Denies similar symptoms in the past. Last BM this morning. Has positive history of Covid x 1 year ago. Has not completed Covid vaccines. Has not completed flu vaccine.  Denies fever, chills, weight changes, chest pain, SOB, constipation, hematochezia, melena, dysuria, difficulty urinating, increased frequency or urgency, flank pain, loss of bowel or bladder function, vaginal discharge, vaginal odor, vaginal bleeding, dyspareunia, pelvic pain.     No LMP recorded. (Menstrual status: Other).  ROS: As per HPI.  All other pertinent ROS negative.     Past Medical History:  Diagnosis Date  . Asthma   . Depression    Past Surgical History:  Procedure Laterality Date  . CESAREAN SECTION    . CHOLECYSTECTOMY    . TUBAL LIGATION     Allergies  Allergen Reactions  . Peanut Butter Flavor Anaphylaxis  . Codeine Other (See Comments)    Bad headache   No current facility-administered medications on file prior to encounter.   Current Outpatient Medications on File Prior to Encounter  Medication Sig Dispense Refill  . naproxen (NAPROSYN) 500 MG tablet Take 1 tablet (500 mg total) by mouth 2 (two) times daily with a meal. 30 tablet 0  . tiZANidine (ZANAFLEX) 4 MG tablet Take 1 tablet (4 mg total) by mouth every 8 (eight) hours as needed. 30 tablet 0  . [DISCONTINUED] estrogens, conjugated, (PREMARIN) 1.25 MG tablet Take 2 tablets (2.5 mg total) by mouth 2 (two) times daily for 4 days. (Patient not  taking: Reported on 09/17/2019) 16 tablet 0  . [DISCONTINUED] ferrous sulfate 325 (65 FE) MG tablet Take 1 tablet (325 mg total) by mouth daily with breakfast for 30 days. (Patient not taking: Reported on 09/17/2019) 30 tablet 0   Social History   Socioeconomic History  . Marital status: Legally Separated    Spouse name: Not on file  . Number of children: Not on file  . Years of education: Not on file  . Highest education level: Not on file  Occupational History  . Not on file  Tobacco Use  . Smoking status: Current Every Day Smoker    Packs/day: 1.00    Types: Cigarettes  . Smokeless tobacco: Never Used  Vaping Use  . Vaping Use: Never used  Substance and Sexual Activity  . Alcohol use: No  . Drug use: No  . Sexual activity: Not Currently    Birth control/protection: Surgical  Other Topics Concern  . Not on file  Social History Narrative  . Not on file   Social Determinants of Health   Financial Resource Strain: Not on file  Food Insecurity: Not on file  Transportation Needs: Not on file  Physical Activity: Not on file  Stress: Not on file  Social Connections: Not on file  Intimate Partner Violence: Not on file   Family History  Problem Relation Age of Onset  . Hypertension Mother   . Hyperlipidemia Mother   . Hypertension Father      OBJECTIVE:  Vitals:   07/26/20 1215  BP: 121/86  Pulse: 81  Resp: (!) 24  Temp: (!) 97.4 F (36.3 C)  TempSrc: Oral  SpO2: 98%    General appearance: Alert; NAD HEENT: NCAT.  Oropharynx clear.  Lungs: clear to auscultation bilaterally without adventitious breath sounds Heart: regular rate and rhythm.  Radial pulses 2+ symmetrical bilaterally Abdomen: soft, non-distended; normal active bowel sounds; non-tender to light and deep palpation; nontender at McBurney's point; negative Murphy's sign; negative rebound; no guarding Back: no CVA tenderness Extremities: no edema; symmetrical with no gross deformities Skin: warm and  dry Neurologic: normal gait Psychological: alert and cooperative; normal mood and affect  LABS: No results found for this or any previous visit (from the past 24 hour(s)).  DIAGNOSTIC STUDIES: No results found.   ASSESSMENT & PLAN:  1. Acute gastritis without hemorrhage, unspecified gastritis type   2. Nausea   3. Diarrhea, unspecified type   4. Viral illness     Meds ordered this encounter  Medications  . ondansetron (ZOFRAN) 4 MG tablet    Sig: Take 1 tablet (4 mg total) by mouth every 6 (six) hours.    Dispense:  12 tablet    Refill:  0    Order Specific Question:   Supervising Provider    Answer:   Merrilee Jansky [id number]  . omeprazole (PRILOSEC) 20 MG capsule    Sig: Take 1 capsule (20 mg total) by mouth daily.    Dispense:  30 capsule    Refill:  0    Order Specific Question:   Supervising Provider    Answer:   Merrilee Jansky [id number]    Prescribed prilosec Get rest and drink plenty of fluids Zofran prescribed.  Take as directed.    DIET Instructions:  30 minutes after taking nausea medicine, begin with sips of clear liquids. If able to hold down 2 - 4 ounces for 30 minutes, begin drinking more. Increase your fluid intake to replace losses. Clear liquids only for 24 hours (water, tea, sport drinks, clear flat ginger ale or cola and juices, broth, jello, popsicles, ect). Advance to bland foods, applesauce, rice, baked or boiled chicken, ect. Avoid milk, greasy foods and anything that doesn't agree with you. Covid swab obtained in office today.   Patient instructed to quarantine until results are back and negative.   If results are negative, patient may resume daily schedule as tolerated once they are fever free for 24 hours without the use of antipyretic medications.   If results are positive, patient instructed to quarantine for at least 5 days from symptom onset.  If after 5 days symptoms have resolved, may return to work with a well fitting mask for the  next 5 days. If symptomatic after day 5, isolation should be extended to 10 days. Patient instructed to follow-up with primary care or with this office as needed.   Patient instructed to follow-up in the ER for trouble swallowing, trouble breathing, other concerning symptoms.    Moshe Cipro, NP 07/26/20 1807

## 2020-07-26 NOTE — Discharge Instructions (Addendum)
I have sent in Zofran for you to take one tablet every 8 hours as needed for nausea.  I have sent in omeprazole for you to take once daily for abdominal pain  Your COVID test is pending.  You should self quarantine until the test result is back.    Take Tylenol or ibuprofen as needed for fever or discomfort.  Rest and keep yourself hydrated.    Follow-up with your primary care provider if your symptoms are not improving.

## 2020-07-27 LAB — SARS CORONAVIRUS 2 (TAT 6-24 HRS): SARS Coronavirus 2: NEGATIVE

## 2020-10-25 ENCOUNTER — Other Ambulatory Visit: Payer: Self-pay

## 2020-10-25 ENCOUNTER — Ambulatory Visit (HOSPITAL_COMMUNITY)
Admission: EM | Admit: 2020-10-25 | Discharge: 2020-10-25 | Disposition: A | Discharge status: Home / Self Care | Payer: Medicaid Other | Attending: Family Medicine | Admitting: Family Medicine

## 2020-10-25 ENCOUNTER — Encounter (HOSPITAL_COMMUNITY): Payer: Self-pay

## 2020-10-25 DIAGNOSIS — W5501XA Bitten by cat, initial encounter: Secondary | ICD-10-CM

## 2020-10-25 DIAGNOSIS — L03114 Cellulitis of left upper limb: Secondary | ICD-10-CM | POA: Diagnosis not present

## 2020-10-25 MED ORDER — TETANUS-DIPHTH-ACELL PERTUSSIS 5-2.5-18.5 LF-MCG/0.5 IM SUSY
PREFILLED_SYRINGE | INTRAMUSCULAR | Status: AC
  Filled 2020-10-25: qty 0.5

## 2020-10-25 MED ORDER — AMOXICILLIN-POT CLAVULANATE 875-125 MG PO TABS: 1.0000 | ORAL_TABLET | Freq: Two times a day (BID) | ORAL | 0 refills | Status: DC

## 2020-10-25 MED ORDER — TETANUS-DIPHTH-ACELL PERTUSSIS 5-2.5-18.5 LF-MCG/0.5 IM SUSY
0.5000 mL | PREFILLED_SYRINGE | Freq: Once | INTRAMUSCULAR | Status: AC
  Administered 2020-10-25: 0.5 mL via INTRAMUSCULAR

## 2020-10-25 NOTE — Discharge Instructions (Signed)
If not allergic, you may use over the counter ibuprofen or acetaminophen as needed. ° °

## 2020-10-25 NOTE — ED Provider Notes (Signed)
  [facility]   [id number] 10/25/20 Arrival Time: 1743  ASSESSMENT & PLAN:  1. Cellulitis of left upper extremity   2. Cat bite, initial encounter    Begin: Meds ordered this encounter  Medications  . amoxicillin-clavulanate (AUGMENTIN) 875-125 MG tablet    Sig: Take 1 tablet by mouth every 12 (twelve) hours.    Dispense:  20 tablet    Refill:  0  . Tdap (BOOSTRIX) injection 0.5 mL     Discharge Instructions     If not allergic, you may use over the counter ibuprofen or acetaminophen as needed.     Reviewed expectations re: course of current medical issues. Questions answered. Outlined signs and symptoms indicating need for more acute intervention. Patient verbalized understanding. After Visit Summary given.   SUBJECTIVE:  Karla Carey is a 45 y.o. female who presents with a possible infection of her L hand s/p cat bite. Animal UTD on shots. No bleeding. Afebrile. Area around bite with pain/discomfort. No tx PTA. No extremity sensation changes or weakness.  Unsure of Td status.  OBJECTIVE:  Vitals:   10/25/20 1835 10/25/20 1836  BP:  136/85  Pulse: 94   Resp: 20   Temp: 99 F (37.2 C)   TempSrc: Oral   SpO2: 100%     General appearance: alert; no distress LUE: two puncture wounds of L hand between thumb and index finger; surrounding erythema and warmth; no active drainage or bleeding Psychological: alert and cooperative; normal mood and affect  Allergies  Allergen Reactions  . Peanut Butter Flavor Anaphylaxis  . Codeine Other (See Comments)    Bad headache    Past Medical History:  Diagnosis Date  . Asthma   . Depression    Social History   Socioeconomic History  . Marital status: Legally Separated    Spouse name: Not on file  . Number of children: Not on file  . Years of education: Not on file  . Highest education level: Not on file  Occupational History  . Not on file  Tobacco Use  . Smoking status: Current Every Day Smoker     Packs/day: 1.00    Types: Cigarettes  . Smokeless tobacco: Never Used  Vaping Use  . Vaping Use: Never used  Substance and Sexual Activity  . Alcohol use: No  . Drug use: No  . Sexual activity: Not Currently    Birth control/protection: Surgical  Other Topics Concern  . Not on file  Social History Narrative  . Not on file   Social Determinants of Health   Financial Resource Strain: Not on file  Food Insecurity: Not on file  Transportation Needs: Not on file  Physical Activity: Not on file  Stress: Not on file  Social Connections: Not on file   Family History  Problem Relation Age of Onset  . Hypertension Mother   . Hyperlipidemia Mother   . Hypertension Father    Past Surgical History:  Procedure Laterality Date  . CESAREAN SECTION    . CHOLECYSTECTOMY    . TUBAL LIGATION             Mardella Layman, MD 10/25/20 Mikle Bosworth

## 2020-10-25 NOTE — ED Triage Notes (Signed)
Pt c/o a cat bite that happened Sunday night.

## 2021-01-01 ENCOUNTER — Ambulatory Visit (HOSPITAL_COMMUNITY)
Admission: EM | Admit: 2021-01-01 | Discharge: 2021-01-01 | Disposition: A | Discharge status: Home / Self Care | Payer: Medicaid Other | Attending: Internal Medicine | Admitting: Internal Medicine

## 2021-01-01 ENCOUNTER — Ambulatory Visit (INDEPENDENT_AMBULATORY_CARE_PROVIDER_SITE_OTHER): Payer: Medicaid Other

## 2021-01-01 ENCOUNTER — Encounter (HOSPITAL_COMMUNITY): Payer: Self-pay | Admitting: *Deleted

## 2021-01-01 ENCOUNTER — Other Ambulatory Visit: Payer: Self-pay

## 2021-01-01 DIAGNOSIS — M25562 Pain in left knee: Secondary | ICD-10-CM

## 2021-01-01 DIAGNOSIS — M25561 Pain in right knee: Secondary | ICD-10-CM

## 2021-01-01 DIAGNOSIS — W19XXXA Unspecified fall, initial encounter: Secondary | ICD-10-CM

## 2021-01-01 MED ORDER — MELOXICAM 15 MG PO TABS: 15.0000 mg | ORAL_TABLET | Freq: Every day | ORAL | 0 refills | Status: DC

## 2021-01-01 NOTE — ED Triage Notes (Addendum)
Pt tripped and fell onto concrete steps and now has pain to LT knee and Rt leg.

## 2021-01-01 NOTE — ED Provider Notes (Signed)
[facility]    CSN: [id number] Arrival date & time: 01/01/21  1010      History   Chief Complaint Chief Complaint  Patient presents with   Knee Injury    HPI Karla Carey is a 45 y.o. female.   HPI  Knee Pain: Patient reports that a few weeks ago she was walking up her steps carrying groceries when she accidentally tripped on her porch and fell onto concrete.  She reports that she fell on her knees and her hands.  She did not hit her head or have LOC. She states that from this event she has had left knee pain.  Also has some right knee pain but this is much more mild in nature.  She states that she barely had any cuts or bruises at the time of the event and she has been able to walk and work since but she notes that when she stands all day or works going up and down steps she does have some swelling and pain in the knees.  She states that going down the steps is worse and going up the steps.  No instability noted.  She does have a little bit of numbness and tingling of the feet when her legs become more swollen from the knee pain.  She is not taking anything on a regular basis for symptoms. No history of knee procedures.    Past Medical History:  Diagnosis Date   Asthma    Depression     Patient Active Problem List   Diagnosis Date Noted   Asthma exacerbation 06/13/2018   Depression 06/13/2018   Tobacco abuse 06/13/2018   Community acquired pneumonia 06/13/2018    Past Surgical History:  Procedure Laterality Date   CESAREAN SECTION     CHOLECYSTECTOMY     TUBAL LIGATION      OB History   No obstetric history on file.      Home Medications    Prior to Admission medications   Medication Sig Start Date End Date Taking? Authorizing Provider  amoxicillin-clavulanate (AUGMENTIN) 875-125 MG tablet Take 1 tablet by mouth every 12 (twelve) hours. 10/25/20   Mardella Layman, MD  naproxen (NAPROSYN) 500 MG tablet Take 1 tablet (500 mg total) by mouth 2 (two) times  daily with a meal. 03/01/20   Wallis Bamberg, PA-C  omeprazole (PRILOSEC) 20 MG capsule Take 1 capsule (20 mg total) by mouth daily. 07/26/20 08/25/20  Moshe Cipro, NP  ondansetron (ZOFRAN) 4 MG tablet Take 1 tablet (4 mg total) by mouth every 6 (six) hours. 07/26/20   Moshe Cipro, NP  tiZANidine (ZANAFLEX) 4 MG tablet Take 1 tablet (4 mg total) by mouth every 8 (eight) hours as needed. 03/01/20   Wallis Bamberg, PA-C  estrogens, conjugated, (PREMARIN) 1.25 MG tablet Take 2 tablets (2.5 mg total) by mouth 2 (two) times daily for 4 days. Patient not taking: Reported on 09/17/2019 06/15/18 03/01/20  Rolly Salter, MD  ferrous sulfate 325 (65 FE) MG tablet Take 1 tablet (325 mg total) by mouth daily with breakfast for 30 days. Patient not taking: Reported on 09/17/2019 06/15/18 03/01/20  Rolly Salter, MD    Family History Family History  Problem Relation Age of Onset   Hypertension Mother    Hyperlipidemia Mother    Hypertension Father     Social History Social History   Tobacco Use   Smoking status: Every Day    Packs/day: 1.00    Types: Cigarettes  Smokeless tobacco: Never  Vaping Use   Vaping Use: Never used  Substance Use Topics   Alcohol use: No   Drug use: No     Allergies   Peanut butter flavor and Codeine   Review of Systems Review of Systems  As stated above in HPI Physical Exam Triage Vital Signs ED Triage Vitals  Enc Vitals Group     BP 01/01/21 1103 115/79     Pulse Rate 01/01/21 1103 81     Resp 01/01/21 1103 16     Temp 01/01/21 1103 97.9 F (36.6 C)     Temp src --      SpO2 01/01/21 1103 98 %     Weight --      Height --      Head Circumference --      Peak Flow --      Pain Score 01/01/21 1105 10     Pain Loc --      Pain Edu? --      Excl. in GC? --    No data found.  Updated Vital Signs BP 115/79   Pulse 81   Temp 97.9 F (36.6 C)   Resp 16   LMP 12/01/2020 (Approximate)   SpO2 98%   Physical Exam Vitals and nursing note  reviewed.  Constitutional:      Appearance: Normal appearance.  HENT:     Head: Normocephalic and atraumatic.  Cardiovascular:     Pulses: Normal pulses.  Musculoskeletal:        General: Swelling (scant of right knee) and tenderness (right knee throughout but worse in the area of the patellar tendon) present. Normal range of motion.     Right knee: No crepitus. Normal range of motion. No LCL laxity, MCL laxity, ACL laxity or PCL laxity. Normal alignment, normal meniscus and normal patellar mobility. Normal pulse.     Instability Tests: Anterior drawer test negative. Posterior drawer test negative. Anterior Lachman test negative. Medial McMurray test negative and lateral McMurray test negative.     Left knee: Swelling and effusion present. No crepitus. Normal range of motion. No LCL laxity, MCL laxity, ACL laxity or PCL laxity.Normal alignment, normal meniscus and normal patellar mobility. Normal pulse.     Instability Tests: Anterior drawer test negative. Posterior drawer test negative. Anterior Lachman test negative. Medial McMurray test negative and lateral McMurray test negative.  Skin:    General: Skin is warm.     Coloration: Skin is not jaundiced or pale.     Findings: No bruising.  Neurological:     Mental Status: She is alert and oriented to person, place, and time.     Motor: No weakness.     UC Treatments / Results  Labs (all labs ordered are listed, but only abnormal results are displayed) Labs Reviewed - No data to display  EKG   Radiology No results found.  Procedures Procedures (including critical care time)  Medications Ordered in UC Medications - No data to display  Initial Impression / Assessment and Plan / UC Course  I have reviewed the triage vital signs and the nursing notes.  Pertinent labs & imaging results that were available during my care of the patient were reviewed by me and considered in my medical decision making (see chart for details).      New. X ray unremarkable for bony abnormality.  Since she does continue to have discomfort and it is affecting her work and activities of daily living I  would recommend that she see orthopedics for further evaluation and I have discussed this with patient.  For now sending in Mobic and giving her a knee brace. Final Clinical Impressions(s) / UC Diagnoses   Final diagnoses:  None   Discharge Instructions   None    ED Prescriptions   None    PDMP not reviewed this encounter.   Rushie Chestnut, [place] 01/01/21 1145

## 2021-03-06 ENCOUNTER — Ambulatory Visit (HOSPITAL_COMMUNITY)
Admission: EM | Admit: 2021-03-06 | Discharge: 2021-03-06 | Disposition: A | Discharge status: Home / Self Care | Payer: Medicaid Other | Attending: Internal Medicine | Admitting: Internal Medicine

## 2021-03-06 ENCOUNTER — Other Ambulatory Visit: Payer: Self-pay

## 2021-03-06 ENCOUNTER — Ambulatory Visit (HOSPITAL_COMMUNITY)
Admission: RE | Admit: 2021-03-06 | Discharge: 2021-03-06 | Disposition: A | Discharge status: Home / Self Care | Payer: Medicaid Other | Source: Ambulatory Visit | Attending: Internal Medicine | Admitting: Internal Medicine

## 2021-03-06 DIAGNOSIS — M79662 Pain in left lower leg: Secondary | ICD-10-CM

## 2021-03-06 DIAGNOSIS — M7989 Other specified soft tissue disorders: Secondary | ICD-10-CM | POA: Diagnosis not present

## 2021-03-06 DIAGNOSIS — M79605 Pain in left leg: Secondary | ICD-10-CM | POA: Diagnosis present

## 2021-03-06 MED ORDER — NAPROXEN 375 MG PO TABS: 375.0000 mg | ORAL_TABLET | Freq: Two times a day (BID) | ORAL | 0 refills | Status: DC

## 2021-03-06 MED ORDER — METHOCARBAMOL 500 MG PO TABS: 500.0000 mg | ORAL_TABLET | Freq: Every evening | ORAL | 0 refills | Status: DC | PRN

## 2021-03-06 MED ORDER — IBUPROFEN 800 MG PO TABS
ORAL_TABLET | ORAL | Status: AC
  Filled 2021-03-06: qty 1

## 2021-03-06 MED ORDER — IBUPROFEN 800 MG PO TABS
800.0000 mg | ORAL_TABLET | Freq: Once | ORAL | Status: AC
  Administered 2021-03-06: 800 mg via ORAL

## 2021-03-06 NOTE — Discharge Instructions (Addendum)
Please take medications as prescribed Do not operate a vehicle or heavy machinery after taking muscle relaxants.  Muscle relaxants tend to make you drowsy. We will order an ultrasound of your left leg We will call you with recommendations if the ultrasound results are abnormal. If you have any worsening symptoms please go to the emergency room to be evaluated.

## 2021-03-06 NOTE — ED Provider Notes (Signed)
[facility]    CSN: [id number] Arrival date & time: 03/06/21  1127      History   Chief Complaint Chief Complaint  Patient presents with   Leg Pain   Leg Swelling    HPI Karla Carey is a 45 y.o. female comes to the urgent care with a 3-day history of left leg pain and swelling.  Patient fell 2 months ago and injured both knees.  Patient suffered another mechanical fall about a week ago.  This resulted in right knee pain.  No knee swelling.  Patient complains of severe pain in the left cough of 3 days duration.  Pain is aggravated by movement.  No known relieving factors.  She denies any chest pain or chest pressure.  She has some shortness of breath with exertion but that appears to be unchanged.  No fever or chills.  No cough or bloody sputum.  No dizziness, near syncope or syncopal episodes..  Patient denies any long distance travel.  She cleans peoples houses for living.  HPI  Past Medical History:  Diagnosis Date   Asthma    Depression     Patient Active Problem List   Diagnosis Date Noted   Asthma exacerbation 06/13/2018   Depression 06/13/2018   Tobacco abuse 06/13/2018   Community acquired pneumonia 06/13/2018    Past Surgical History:  Procedure Laterality Date   CESAREAN SECTION     CHOLECYSTECTOMY     TUBAL LIGATION      OB History   No obstetric history on file.      Home Medications    Prior to Admission medications   Medication Sig Start Date End Date Taking? Authorizing Provider  methocarbamol (ROBAXIN) 500 MG tablet Take 1 tablet (500 mg total) by mouth at bedtime as needed for muscle spasms. 03/06/21  Yes Azaela Caracci, Britta Mccreedy, MD  naproxen (NAPROSYN) 375 MG tablet Take 1 tablet (375 mg total) by mouth 2 (two) times daily. 03/06/21  Yes Keevon Henney, Britta Mccreedy, MD  estrogens, conjugated, (PREMARIN) 1.25 MG tablet Take 2 tablets (2.5 mg total) by mouth 2 (two) times daily for 4 days. Patient not taking: Reported on 09/17/2019 06/15/18 03/01/20   Rolly Salter, MD  ferrous sulfate 325 (65 FE) MG tablet Take 1 tablet (325 mg total) by mouth daily with breakfast for 30 days. Patient not taking: Reported on 09/17/2019 06/15/18 03/01/20  Rolly Salter, MD    Family History Family History  Problem Relation Age of Onset   Hypertension Mother    Hyperlipidemia Mother    Hypertension Father     Social History Social History   Tobacco Use   Smoking status: Every Day    Packs/day: 1.00    Types: Cigarettes   Smokeless tobacco: Never  Vaping Use   Vaping Use: Never used  Substance Use Topics   Alcohol use: No   Drug use: No     Allergies   Peanut butter flavor and Codeine   Review of Systems Review of Systems  Constitutional: Negative.   HENT: Negative.    Gastrointestinal: Negative.   Musculoskeletal:  Positive for arthralgias and myalgias. Negative for gait problem, joint swelling and neck pain.  Skin: Negative.   All other systems reviewed and are negative.   Physical Exam Triage Vital Signs ED Triage Vitals  Enc Vitals Group     BP 03/06/21 1245 (!) 148/92     Pulse Rate 03/06/21 1245 87     Resp 03/06/21 1245  20     Temp 03/06/21 1245 98.2 F (36.8 C)     Temp Source 03/06/21 1245 Oral     SpO2 03/06/21 1245 97 %     Weight --      Height --      Head Circumference --      Peak Flow --      Pain Score 03/06/21 1248 9     Pain Loc --      Pain Edu? --      Excl. in GC? --    No data found.  Updated Vital Signs BP (!) 148/92 (BP Location: Left Arm)   Pulse 87   Temp 98.2 F (36.8 C) (Oral)   Resp 20   SpO2 97%   Visual Acuity Right Eye Distance:   Left Eye Distance:   Bilateral Distance:    Right Eye Near:   Left Eye Near:    Bilateral Near:     Physical Exam Vitals and nursing note reviewed.  Constitutional:      General: She is not in acute distress. Cardiovascular:     Rate and Rhythm: Normal rate and regular rhythm.  Musculoskeletal:     Comments: Left calf tenderness and  swelling.  Skin:    General: Skin is warm and dry.  Neurological:     Mental Status: She is alert.     UC Treatments / Results  Labs (all labs ordered are listed, but only abnormal results are displayed) Labs Reviewed - No data to display  EKG   Radiology No results found.  Procedures Procedures (including critical care time)  Medications Ordered in UC Medications  ibuprofen (ADVIL) tablet 800 mg (800 mg Oral Given 03/06/21 1334)    Initial Impression / Assessment and Plan / UC Course  I have reviewed the triage vital signs and the nursing notes.  Pertinent labs & imaging results that were available during my care of the patient were reviewed by me and considered in my medical decision making (see chart for details).     1.  Left calf pain: DVT screen Ibuprofen 800 mg x 1 dose Doppler ultrasound has been ordered We will call patient with recommendations once ultrasound results are available. Final Clinical Impressions(s) / UC Diagnoses   Final diagnoses:  Pain of left calf     Discharge Instructions      Please take medications as prescribed Do not operate a vehicle or heavy machinery after taking muscle relaxants.  Muscle relaxants tend to make you drowsy. We will order an ultrasound of your left leg We will call you with recommendations if the ultrasound results are abnormal. If you have any worsening symptoms please go to the emergency room to be evaluated.   ED Prescriptions     Medication Sig Dispense Auth. Provider   naproxen (NAPROSYN) 375 MG tablet Take 1 tablet (375 mg total) by mouth 2 (two) times daily. 20 tablet Kariana Wiles, Britta Mccreedy, MD   methocarbamol (ROBAXIN) 500 MG tablet Take 1 tablet (500 mg total) by mouth at bedtime as needed for muscle spasms. 20 tablet Caleel Kiner, Britta Mccreedy, MD      PDMP not reviewed this encounter.   Merrilee Jansky, MD 03/06/21 1356

## 2021-03-06 NOTE — ED Triage Notes (Signed)
Pt presents with bilateral leg pain and swelling for past few days; pt has had 2 falls in the past 2 months

## 2021-12-11 ENCOUNTER — Encounter (HOSPITAL_COMMUNITY): Payer: Self-pay | Admitting: Emergency Medicine

## 2021-12-11 ENCOUNTER — Ambulatory Visit (HOSPITAL_COMMUNITY)
Admission: EM | Admit: 2021-12-11 | Discharge: 2021-12-11 | Disposition: A | Discharge status: Home / Self Care | Payer: Medicaid Other | Attending: Physician Assistant | Admitting: Physician Assistant

## 2021-12-11 DIAGNOSIS — M62830 Muscle spasm of back: Secondary | ICD-10-CM | POA: Diagnosis not present

## 2021-12-11 DIAGNOSIS — T148XXA Other injury of unspecified body region, initial encounter: Secondary | ICD-10-CM | POA: Diagnosis not present

## 2021-12-11 DIAGNOSIS — R350 Frequency of micturition: Secondary | ICD-10-CM

## 2021-12-11 DIAGNOSIS — M546 Pain in thoracic spine: Secondary | ICD-10-CM | POA: Diagnosis present

## 2021-12-11 LAB — POCT URINALYSIS DIPSTICK, ED / UC
Bilirubin Urine: NEGATIVE
Glucose, UA: NEGATIVE mg/dL
Hgb urine dipstick: NEGATIVE
Ketones, ur: NEGATIVE mg/dL
Leukocytes,Ua: NEGATIVE
Nitrite: NEGATIVE
Protein, ur: NEGATIVE mg/dL
Specific Gravity, Urine: >=1.03 (ref 1.005–1.030)
Urobilinogen, UA: 0.2 mg/dL (ref 0.0–1.0)
pH: 5 (ref 5.0–8.0)

## 2021-12-11 MED ORDER — NAPROXEN 375 MG PO TABS: 375.0000 mg | ORAL_TABLET | Freq: Two times a day (BID) | ORAL | 0 refills | Status: AC

## 2021-12-11 MED ORDER — METHOCARBAMOL 500 MG PO TABS
500.0000 mg | ORAL_TABLET | Freq: Three times a day (TID) | ORAL | 0 refills | Status: DC
Start: 2021-12-11 — End: 2022-02-06

## 2021-12-11 NOTE — ED Triage Notes (Signed)
Pt reports lower back pain radiating to left lower flank. States the pain began yesterday.  Reports noticing dark colored urine today and urinary frequency beginning lastnight. Denies taking any OTC medications.

## 2021-12-11 NOTE — Discharge Instructions (Addendum)
Advised to take Naprosyn 1 tablet every 12 hours to help decrease the back pain. Advised to take the Robaxin 1 tablet every 8 hours as needed to help reduce muscle spasm. Advised to use ice therapy to the area to help reduce spasm and discomfort, 10 minutes on 20 minutes off. Follow-up PCP or return to urgent care if symptoms fail to improve.

## 2021-12-11 NOTE — ED Provider Notes (Signed)
[facility]    CSN: [id number] Arrival date & time: 12/11/21  1226      History   Chief Complaint Chief Complaint  Patient presents with   Back Pain    HPI Karla Carey is a 46 y.o. female.   46 year old female presents with left flank pain.  Patient relates this morning she woke up with left mid back pain with radiation around to the front of the lower chest wall.  Patient indicates that the pain is worse when she moves, bends, turns.  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  Patient indicates that she has been taking ibuprofen 800 mg OTC 3 times a day for the past 2 months in order to help reduce her bilateral knee pain.  Patient indicates this morning she noticed that her urine was dark in color, and she has had some mild frequency without dysuria.  Patient relates she has not seen any blood in the urine.  Patient also relates that she has had some mild intermittent headaches over the past 2 to 3 days.  Patient denies fever or chills, no nausea or vomiting.   Back Pain   Past Medical History:  Diagnosis Date   Asthma    Depression     Patient Active Problem List   Diagnosis Date Noted   Asthma exacerbation 06/13/2018   Depression 06/13/2018   Tobacco abuse 06/13/2018   Community acquired pneumonia 06/13/2018    Past Surgical History:  Procedure Laterality Date   CESAREAN SECTION     CHOLECYSTECTOMY     TUBAL LIGATION      OB History   No obstetric history on file.      Home Medications    Prior to Admission medications   Medication Sig Start Date End Date Taking? Authorizing Provider  methocarbamol (ROBAXIN) 500 MG tablet Take 1 tablet (500 mg total) by mouth 3 (three) times daily. 12/11/21   Ellsworth Lennox, PA-C  naproxen (NAPROSYN) 375 MG tablet Take 1 tablet (375 mg total) by mouth 2 (two) times daily.  12/11/21   Ellsworth Lennox, PA-C  estrogens, conjugated, (PREMARIN) 1.25 MG tablet Take 2 tablets (2.5 mg total) by mouth 2 (two) times daily for 4 days. Patient not taking: Reported on 09/17/2019 06/15/18 03/01/20  Rolly Salter, MD  ferrous sulfate 325 (65 FE) MG tablet Take 1 tablet (325 mg total) by mouth daily with breakfast for 30 days. Patient not taking: Reported on 09/17/2019 06/15/18 03/01/20  Rolly Salter, MD    Family History Family History  Problem Relation Age of Onset   Hypertension Mother    Hyperlipidemia Mother    Hypertension Father     Social History Social History   Tobacco Use   Smoking status: Every Day    Packs/day: 1.00    Types: Cigarettes   Smokeless tobacco: Never  Vaping Use   Vaping Use: Never used  Substance Use Topics   Alcohol use: No   Drug use: No     Allergies   Peanut butter flavor and Codeine   Review of Systems Review of Systems  Musculoskeletal:  Positive for back pain (left mid back).     Physical Exam Triage Vital Signs ED Triage Vitals  Enc Vitals Group     BP 12/11/21 1409 132/84     Pulse Rate 12/11/21 1409 77  Resp 12/11/21 1409 16     Temp 12/11/21 1409 98.1 F (36.7 C)     Temp Source 12/11/21 1409 Oral     SpO2 12/11/21 1409 96 %     Weight --      Height --      Head Circumference --      Peak Flow --      Pain Score 12/11/21 1407 6     Pain Loc --      Pain Edu? --      Excl. in GC? --    No data found.  Updated Vital Signs BP 132/84 (BP Location: Left Arm)   Pulse 77   Temp 98.1 F (36.7 C) (Oral)   Resp 16   SpO2 96%   Visual Acuity Right Eye Distance:   Left Eye Distance:   Bilateral Distance:    Right Eye Near:   Left Eye Near:    Bilateral Near:     Physical Exam Constitutional:      Appearance: Normal appearance.  Cardiovascular:     Rate and Rhythm: Normal rate and regular rhythm.     Heart sounds: Normal heart sounds.  Pulmonary:     Effort: Pulmonary effort is normal.      Breath sounds: Normal breath sounds and air entry. No wheezing, rhonchi or rales.  Musculoskeletal:     Thoracic back: Spasms and tenderness present.     Comments: Thoracic: Left mid back around T6 T8 there is tenderness of the paraspinous areas and the tenderness extends around to the lateral aspect of the chest.  There is no swelling, rash, or redness noted.  Range of motion is normal but with pain on rotation and flexion.  Neurological:     Mental Status: She is alert.      UC Treatments / Results  Labs (all labs ordered are listed, but only abnormal results are displayed) Labs Reviewed  URINE CULTURE  POCT URINALYSIS DIPSTICK, ED / UC    EKG   Radiology No results found.  Procedures Procedures (including critical care time)  Medications Ordered in UC Medications - No data to display  Initial Impression / Assessment and Plan / UC Course  I have reviewed the triage vital signs and the nursing notes.  Pertinent labs & imaging results that were available during my care of the patient were reviewed by me and considered in my medical decision making (see chart for details).    Plan: 1.  Advised to take the Naprosyn 375 1 twice a day to help relieve the pain from the back. 2. Advised to take Robaxin 1 tablet 2-3 times a day to help reduce the muscle spasms. 3.  Advised to use ice therapy, 10 minutes on 20 minutes off, to help reduce the pain and spasm of the back. 4.  Advised to follow-up PCP or return to urgent care if symptoms fail to improve. Final Clinical Impressions(s) / UC Diagnoses   Final diagnoses:  Acute left-sided thoracic back pain  Muscle spasm of back  Muscle strain  Frequency of urination     Discharge Instructions      Advised to take Naprosyn 1 tablet every 12 hours to help decrease the back pain. Advised to take the Robaxin 1 tablet every 8 hours as needed to help reduce muscle spasm. Advised to use ice therapy to the area to help reduce spasm  and discomfort, 10 minutes on 20 minutes off. Follow-up PCP or return to urgent care  if symptoms fail to improve.    ED Prescriptions     Medication Sig Dispense Auth. Provider   methocarbamol (ROBAXIN) 500 MG tablet Take 1 tablet (500 mg total) by mouth 3 (three) times daily. 20 tablet Ellsworth Lennox, PA-C   naproxen (NAPROSYN) 375 MG tablet Take 1 tablet (375 mg total) by mouth 2 (two) times daily. 20 tablet Ellsworth Lennox, PA-C      PDMP not reviewed this encounter.   Ellsworth Lennox, PA-C 12/11/21 1447

## 2021-12-12 LAB — URINE CULTURE

## 2022-02-06 ENCOUNTER — Encounter (HOSPITAL_COMMUNITY): Payer: Self-pay | Admitting: Emergency Medicine

## 2022-02-06 ENCOUNTER — Other Ambulatory Visit: Payer: Self-pay

## 2022-02-06 ENCOUNTER — Emergency Department (HOSPITAL_COMMUNITY)
Admission: EM | Admit: 2022-02-06 | Discharge: 2022-02-06 | Disposition: A | Discharge status: Home / Self Care | Payer: Medicaid Other | Attending: Emergency Medicine | Admitting: Emergency Medicine

## 2022-02-06 DIAGNOSIS — M542 Cervicalgia: Secondary | ICD-10-CM | POA: Insufficient documentation

## 2022-02-06 MED ORDER — PROCHLORPERAZINE EDISYLATE 10 MG/2ML IJ SOLN
10.0000 mg | Freq: Once | INTRAMUSCULAR | Status: AC
Start: 2022-02-06 — End: 2022-02-06
  Administered 2022-02-06: 10 mg via INTRAVENOUS
  Filled 2022-02-06: qty 2

## 2022-02-06 MED ORDER — METHOCARBAMOL 500 MG PO TABS
500.0000 mg | ORAL_TABLET | Freq: Three times a day (TID) | ORAL | 0 refills | Status: DC
Start: 2022-02-06 — End: 2022-02-06

## 2022-02-06 MED ORDER — LIDOCAINE 5 % EX PTCH
1.0000 | MEDICATED_PATCH | Freq: Once | CUTANEOUS | Status: DC
  Administered 2022-02-06: 1 via TRANSDERMAL
  Filled 2022-02-06: qty 1

## 2022-02-06 MED ORDER — METHOCARBAMOL 500 MG PO TABS
500.0000 mg | ORAL_TABLET | Freq: Two times a day (BID) | ORAL | 0 refills | Status: AC
Start: 2022-02-06 — End: ?

## 2022-02-06 MED ORDER — DIPHENHYDRAMINE HCL 50 MG/ML IJ SOLN
25.0000 mg | Freq: Once | INTRAMUSCULAR | Status: AC
  Administered 2022-02-06: 25 mg via INTRAVENOUS
  Filled 2022-02-06: qty 1

## 2022-02-06 MED ORDER — DEXAMETHASONE SODIUM PHOSPHATE 10 MG/ML IJ SOLN
10.0000 mg | Freq: Once | INTRAMUSCULAR | Status: AC
  Administered 2022-02-06: 10 mg via INTRAVENOUS
  Filled 2022-02-06: qty 1

## 2022-02-06 MED ORDER — MELOXICAM 7.5 MG PO TABS: 7.5000 mg | ORAL_TABLET | Freq: Every day | ORAL | 0 refills | Status: DC

## 2022-02-06 MED ORDER — KETOROLAC TROMETHAMINE 15 MG/ML IJ SOLN
15.0000 mg | Freq: Once | INTRAMUSCULAR | Status: AC
Start: 2022-02-06 — End: 2022-02-06
  Administered 2022-02-06: 15 mg via INTRAVENOUS
  Filled 2022-02-06: qty 1

## 2022-02-06 NOTE — ED Provider Notes (Signed)
[facility] EMERGENCY DEPARTMENT Provider Note   CSN: [id number] Arrival date & time: 02/06/22  0544     History  Chief Complaint  Patient presents with   Neck Pain     SYMPHANI Karla Carey is a 46 y.o. female.  Patient with history of asthma and depression presents today with complaints of neck pain. She states that same began on Friday afternoon after she worked all day. Pain is located on the right side of her neck. 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 She states that her head has been hurting as well as her shoulder. States that her significant other massaged her neck for her with relief of her pain but states it came back soon after. She has been taking ibuprofen with minimal improvement. Denies any fevers, chills, photophobia, phonophobia, blurred vision, cough, congestion, shortness of breath, chest pain, nausea, vomiting.  The history is provided by the patient. No language interpreter was used.       Home Medications Prior to Admission medications   Medication Sig Start Date End Date Taking? Authorizing Provider  methocarbamol (ROBAXIN) 500 MG tablet Take 1 tablet (500 mg total) by mouth 3 (three) times daily. 12/11/21   Ellsworth Lennox, PA-C  naproxen (NAPROSYN) 375 MG tablet Take 1 tablet (375 mg total) by mouth 2 (two) times daily. 12/11/21   Ellsworth Lennox, PA-C  estrogens, conjugated, (PREMARIN) 1.25 MG tablet Take 2 tablets (2.5 mg total) by mouth 2 (two) times daily for 4 days. Patient not taking: Reported on 09/17/2019 06/15/18 03/01/20  Rolly Salter, MD  ferrous sulfate 325 (65 FE) MG tablet Take 1 tablet (325 mg total) by mouth daily with breakfast for 30 days. Patient not taking: Reported on 09/17/2019 06/15/18 03/01/20  Rolly Salter, MD      Allergies    Peanut butter flavor and Codeine    Review of Systems   Review of Systems  Musculoskeletal:  Positive for myalgias.  All other  systems reviewed and are negative.   Physical Exam Updated Vital Signs BP 127/73   Pulse 66   Temp 97.6 F (36.4 C) (Oral)   Resp (!) 22   SpO2 98%  Physical Exam Vitals and nursing note reviewed.  Constitutional:      General: She is not in acute distress.    Appearance: Normal appearance. She is normal weight. She is not ill-appearing, toxic-appearing or diaphoretic.  HENT:     Head: Normocephalic and atraumatic.  Eyes:     Extraocular Movements: Extraocular movements intact.     Pupils: Pupils are equal, round, and reactive to light.  Neck:     Meningeal: Brudzinski's sign and Kernig's sign absent.     Comments: Palpable muscle tightness and tenderness along the musculature of the right neck and into the shoulder. ROM intact with mild discomfort. No crepitus, deformity, overlying skin changes, or bony tenderness.  No midline cervical spine tenderness.  No meningismus. Cardiovascular:     Rate and Rhythm: Normal rate.  Pulmonary:     Effort: Pulmonary effort is normal. No respiratory distress.  Musculoskeletal:        General: Normal range of motion.     Cervical back: Normal range of motion and neck supple.     Comments: When look at the patient standing with her shoulders relaxed, obvious muscle imbalance with right side being much more toned than the left.   5/5  strength intact to bilateral upper extremities.  Lymphadenopathy:     Cervical: No cervical adenopathy.  Skin:    General: Skin is warm and dry.  Neurological:     General: No focal deficit present.     Mental Status: She is alert and oriented to person, place, and time.     Sensory: No sensory deficit.     Motor: No weakness.     Gait: Gait normal.  Psychiatric:        Mood and Affect: Mood normal.        Behavior: Behavior normal.     ED Results / Procedures / Treatments   Labs (all labs ordered are listed, but only abnormal results are displayed) Labs Reviewed - No data to  display  EKG None  Radiology No results found.  Procedures Procedures    Medications Ordered in ED Medications  lidocaine (LIDODERM) 5 % 1 patch (has no administration in time range)  prochlorperazine (COMPAZINE) injection 10 mg (has no administration in time range)  diphenhydrAMINE (BENADRYL) injection 25 mg (has no administration in time range)  ketorolac (TORADOL) 15 MG/ML injection 15 mg (has no administration in time range)  dexamethasone (DECADRON) injection 10 mg (has no administration in time range)    ED Course/ Medical Decision Making/ A&P                           Medical Decision Making Risk Prescription drug management.   Patient presents today with right sided neck pain. She is afebrile, non-toxic appearing, and in no acute distress with reassuring vital signs. Physical exam reveals palpable muscle tightness and tenderness noted along the right side of the neck with obvious muscle imbalance between musculature of the right upper extremity vs the left. No meningeal signs. Considered imaging, but patient without deficits or bony tenderness. Also denies a specific injury, therefore will defer imaging at this time. Given that massage relieved her symptoms, low suspicion for emergent concerns. Suspect pain is from muscle overuse. Patient given lidocaine patch and headache cocktail with improvement. She is stable for discharge. Will give Robaxin and anti-inflammatory prescription for management and include stretching exercises to her paperwork. Patient is understanding and amenable with plan, educated on red flag symptoms that would prompt immediate return. Discharged in stable condition.   Final Clinical Impression(s) / ED Diagnoses Final diagnoses:  Neck pain on right side    Rx / DC Orders ED Discharge Orders          Ordered    methocarbamol (ROBAXIN) 500 MG tablet  2 times daily        02/06/22 0836    meloxicam (MOBIC) 7.5 MG tablet  Daily        02/06/22  0836    methocarbamol (ROBAXIN) 500 MG tablet  3 times daily,   Status:  Discontinued        02/06/22 0836          An After Visit Summary was printed and given to the patient.     Vear Clock 02/06/22 2229    Glyn Ade, MD 02/06/22 1544

## 2022-02-06 NOTE — ED Notes (Signed)
Patient is resting comfortably. 

## 2022-02-06 NOTE — Discharge Instructions (Signed)
I have given you a prescription for a muscle relaxer and anti-inflammatory medication for you to take as prescribed as needed for management of your symptoms.  You may also use lidocaine patches that you can get over-the-counter at your local pharmacy.  I have also given you a packet about exercises that you can do to help with your symptoms.  I also recommend that you try to use your left arm for some things to help rest your right side.  I have also given you a referral to orthopedics with a number to call to schedule an appointment as needed for continued evaluation and management of your symptoms.  Return if development of any new or worsening symptoms.

## 2022-02-06 NOTE — ED Triage Notes (Signed)
Patient reports right lateral neck pain with mild headache onset last weekend , denies injury . No stiffness or fever .

## 2022-09-10 ENCOUNTER — Encounter: Payer: Self-pay | Admitting: *Deleted

## 2023-06-12 ENCOUNTER — Encounter (HOSPITAL_COMMUNITY): Payer: Self-pay | Admitting: *Deleted

## 2023-06-12 ENCOUNTER — Other Ambulatory Visit: Payer: Self-pay

## 2023-06-12 ENCOUNTER — Ambulatory Visit (HOSPITAL_COMMUNITY)
Admission: EM | Admit: 2023-06-12 | Discharge: 2023-06-12 | Disposition: A | Discharge status: Home / Self Care | Payer: Medicaid Other | Attending: Sports Medicine | Admitting: Sports Medicine

## 2023-06-12 DIAGNOSIS — M542 Cervicalgia: Secondary | ICD-10-CM

## 2023-06-12 NOTE — ED Triage Notes (Signed)
 Pt reports a lump on Lt side of neck that is causing pain. Pt denies any injury.

## 2023-06-12 NOTE — ED Provider Notes (Signed)
 [facility]    CSN: [id number] Arrival date & time: 06/12/23  1104      History   Chief Complaint Chief Complaint  Patient presents with   Neck Pain    HPI Karla Carey is a 48 y.o. female.   Karla Carey is a 48yo female here for evaluation of left sided neck pain. She states it's been present for about 5 days.  She denies any inciting injury.  She does feel slight lump on the left side of the neck and wanted this evaluated.  She denies being sick recently though does have some spreading of the pain at the posterior lateral aspect of her head causing her to have a headache.  She does housework and is frequently using her hands and cleaning things which she thinks is contributing to this.  She has not tried any medications for it thus far.  She denies any radiating pain or numbness or tingling down the left upper extremity.  The history is provided by the patient.  Neck Pain   Past Medical History:  Diagnosis Date   Asthma    Depression     Patient Active Problem List   Diagnosis Date Noted   Asthma exacerbation 06/13/2018   Depression 06/13/2018   Tobacco abuse 06/13/2018   Community acquired pneumonia 06/13/2018    Past Surgical History:  Procedure Laterality Date   CESAREAN SECTION     CHOLECYSTECTOMY     TUBAL LIGATION      OB History   No obstetric history on file.      Home Medications    Prior to Admission medications   Medication Sig Start Date End Date Taking? Authorizing Provider  meloxicam  (MOBIC ) 7.5 MG tablet Take 1 tablet (7.5 mg total) by mouth daily. 02/06/22   Smoot, Sarah A, PA-C  methocarbamol  (ROBAXIN ) 500 MG tablet Take 1 tablet (500 mg total) by mouth 2 (two) times daily. 02/06/22   Smoot, Genevive Ket, PA-C  naproxen  (NAPROSYN ) 375 MG tablet Take 1 tablet (375 mg total) by mouth 2 (two) times daily. 12/11/21   Gretel Leaven, PA-C  estrogens , conjugated, (PREMARIN ) 1.25 MG tablet Take 2 tablets (2.5 mg total) by mouth 2 (two) times daily  for 4 days. Patient not taking: Reported on 09/17/2019 06/15/18 03/01/20  Kraig Peru, MD  ferrous sulfate  325 (65 FE) MG tablet Take 1 tablet (325 mg total) by mouth daily with breakfast for 30 days. Patient not taking: Reported on 09/17/2019 06/15/18 03/01/20  Kraig Peru, MD    Family History Family History  Problem Relation Age of Onset   Hypertension Mother    Hyperlipidemia Mother    Hypertension Father     Social History Social History   Tobacco Use   Smoking status: Every Day    Current packs/day: 1.00    Types: Cigarettes   Smokeless tobacco: Never  Vaping Use   Vaping status: Never Used  Substance Use Topics   Alcohol use: No   Drug use: No     Allergies   Peanut butter flavoring agent (non-screening) and Codeine   Review of Systems Review of Systems  Musculoskeletal:  Positive for neck pain.     Physical Exam Triage Vital Signs ED Triage Vitals  Encounter Vitals Group     BP 06/12/23 1128 126/85     Systolic BP Percentile --      Diastolic BP Percentile --      Pulse Rate 06/12/23 1128 74  Resp 06/12/23 1128 18     Temp 06/12/23 1128 98.3 F (36.8 C)     Temp src --      SpO2 06/12/23 1128 98 %     Weight --      Height --      Head Circumference --      Peak Flow --      Pain Score 06/12/23 1127 6     Pain Loc --      Pain Education --      Exclude from Growth Chart --    No data found.  Updated Vital Signs BP 126/85   Pulse 74   Temp 98.3 F (36.8 C)   Resp 18   SpO2 98%   Visual Acuity Right Eye Distance:   Left Eye Distance:   Bilateral Distance:    Right Eye Near:   Left Eye Near:    Bilateral Near:     Physical Exam Constitutional:      Appearance: Normal appearance. She is obese.  HENT:     Head: Normocephalic and atraumatic.     Nose: Nose normal. No congestion or rhinorrhea.     Mouth/Throat:     Mouth: Mucous membranes are moist.     Pharynx: Oropharynx is clear.  Neck:     Comments: She has point  tenderness along the midportion of the sternocleidomastoid muscle up to its attachment to the mastoid.  She has some point tenderness and muscle spasm palpable in the trapezius.  She has no palpable lumps or masses elsewhere in the neck.  No significant lymphadenopathy.  Neck range of motion is normal and no midline cervical spine tenderness to palpation..  Negative Spurling's. Musculoskeletal:     Cervical back: Normal range of motion and neck supple. Tenderness present. No rigidity.     Comments: Left upper extremity range of motion, sensation, and strength is all normal.  Lymphadenopathy:     Cervical: No cervical adenopathy.  Neurological:     General: No focal deficit present.     Mental Status: She is alert and oriented to person, place, and time. Mental status is at baseline.  Psychiatric:        Mood and Affect: Mood normal.        Behavior: Behavior normal.      UC Treatments / Results  Labs (all labs ordered are listed, but only abnormal results are displayed) Labs Reviewed - No data to display  EKG   Radiology No results found.  Procedures Procedures (including critical care time)  Medications Ordered in UC Medications - No data to display  Initial Impression / Assessment and Plan / UC Course  I have reviewed the triage vital signs and the nursing notes.  Pertinent labs & imaging results that were available during my care of the patient were reviewed by me and considered in my medical decision making (see chart for details).    Vitals and triage reviewed, patient is hemodynamically stable.  Reviewed with the patient based on her physical exam I think that this is more of a myofascial pain presentation involving the sternocleido mastoid muscle and her left trapezius muscle. She has no meningeal signs or red-flag findings on exam. I do not feel any palpable lumps aside from the muscle spasms that would concern for swollen lymph node or other mass.  I recommended  avoiding medications and treatment of this rather focusing on neck stretches and an exercise program in addition to posture management of  neck pain.  Reviewed with her home exercises to continue over the next few weeks.  We discussed heating and cool therapy to help with some of the discomfort.  Advised follow-up with her PCP or sports medicine in the next 3 to 4 weeks if this is failing to improve.  Patient's questions were answered and she is in agreement this plan.  Final Clinical Impressions(s) / UC Diagnoses   Final diagnoses:  Neck pain     Discharge Instructions      I do not see any concerning findings on your exam that make me think we need to do any imaging. The tenderness and swollen spots you feel are muscle spasms in the SCM (sternocleidomastoid) and Trapezius muscles. This is best treated with posture control, neck stretches and strengthening exercises. I have provided you a handout with specific exercises to work on.  Posture Recommendations for Managing Neck Pain 1. Maintain a Neutral Neck Position:  Keeping the neck in a neutral position, where the head is aligned with the spine, can reduce strain on the cervical muscles and alleviate pain.  Avoid forward head posture, which is commonly associated with neck pain and increased muscle activity in the upper trapezius.   If the pain is persisting or worsening over the next 3-4 weeks, please follow-up with your primary care doctor or [facility] and we can get you in with formal physical therapy.   ED Prescriptions   None    PDMP not reviewed this encounter.   Marliss Simple, MD 06/12/23 [phone]

## 2023-06-12 NOTE — Discharge Instructions (Signed)
 I do not see any concerning findings on your exam that make me think we need to do any imaging. The tenderness and swollen spots you feel are muscle spasms in the SCM (sternocleidomastoid) and Trapezius muscles. This is best treated with posture control, neck stretches and strengthening exercises. I have provided you a handout with specific exercises to work on.  Posture Recommendations for Managing Neck Pain 1. Maintain a Neutral Neck Position:  Keeping the neck in a neutral position, where the head is aligned with the spine, can reduce strain on the cervical muscles and alleviate pain.  Avoid forward head posture, which is commonly associated with neck pain and increased muscle activity in the upper trapezius.   If the pain is persisting or worsening over the next 3-4 weeks, please follow-up with your primary care doctor or [facility] and we can get you in with formal physical therapy.

## 2023-12-13 ENCOUNTER — Emergency Department (HOSPITAL_COMMUNITY)

## 2023-12-13 ENCOUNTER — Other Ambulatory Visit: Payer: Self-pay

## 2023-12-13 ENCOUNTER — Emergency Department (HOSPITAL_COMMUNITY)
Admission: EM | Admit: 2023-12-13 | Discharge: 2023-12-13 | Disposition: A | Discharge status: Home / Self Care | Attending: Emergency Medicine | Admitting: Emergency Medicine

## 2023-12-13 ENCOUNTER — Encounter (HOSPITAL_COMMUNITY): Payer: Self-pay

## 2023-12-13 DIAGNOSIS — M79605 Pain in left leg: Secondary | ICD-10-CM | POA: Diagnosis not present

## 2023-12-13 DIAGNOSIS — Z91010 Allergy to peanuts: Secondary | ICD-10-CM | POA: Diagnosis not present

## 2023-12-13 DIAGNOSIS — R2242 Localized swelling, mass and lump, left lower limb: Secondary | ICD-10-CM | POA: Insufficient documentation

## 2023-12-13 MED ORDER — MELOXICAM 7.5 MG PO TABS: 7.5000 mg | ORAL_TABLET | Freq: Every day | ORAL | 0 refills | Status: AC

## 2023-12-13 NOTE — ED Notes (Signed)
 Patient transported to vascular.

## 2023-12-13 NOTE — ED Triage Notes (Signed)
 Pt is coming for home with leg pain pain, it is left lower leg pain that below the knee. No obvious deformities or trauma. She mentions she got up to go to the bathroom and started having stabbing pain. There is well documented Hx with her left lower leg with US  and imaging. No other complaints.   Medic vitals   170 palp 78hr 98%ra 18rr

## 2023-12-13 NOTE — ED Provider Triage Note (Signed)
 Emergency Medicine Provider Triage Evaluation Note  Karla Carey , a 48 y.o. female  was evaluated in triage.  Pt complains of left leg pain just below the left knee.  Patient reports getting up to go to the bathroom with sudden onset stabbing pain.  She denies any known injury.  Review of Systems  Positive:  Negative:   Physical Exam  BP 129/86 (BP Location: Left Arm)   Pulse 85   Temp 98.6 F (37 C) (Oral)   Resp 18   SpO2 98%  Gen:   Awake, no distress   Resp:  Normal effort  MSK:   Moves extremities without difficulty  Other:    Medical Decision Making  Medically screening exam initiated at 1:56 AM.  Appropriate orders placed.  Karla Carey was informed that the remainder of the evaluation will be completed by another provider, this initial triage assessment does not replace that evaluation, and the importance of remaining in the ED until their evaluation is complete.     Logan Ubaldo NOVAK, PA-C 12/13/23 0157

## 2023-12-13 NOTE — Progress Notes (Signed)
 Left lower ext venous  has been completed. Refer to [facility] under chart review to view preliminary results.   12/13/2023  9:27 AM Mihran Lebarron, Ricka BIRCH

## 2023-12-13 NOTE — ED Provider Notes (Signed)
 [place] EMERGENCY DEPARTMENT AT [facility] Provider Note   CSN: [id number] Arrival date & time: 12/13/23  0148     Patient presents with: Leg Pain   Karla Carey is a 48 y.o. female.   Patient complains of pain and swelling in her left leg.  Patient reports that She has a stabbing pain with walking.  Patient reports that she noticed swelling.  Patient denies any history of arthritis or problems with her knee.  Patient has not had any redness or swelling she has not had any fever or chills.  Patient denies any cough or shortness of breath.  Patient states she had an injury several years ago.  No recent injury  The history is provided by the patient. No language interpreter was used.  Leg Pain      Prior to Admission medications   Medication Sig Start Date End Date Taking? Authorizing Provider  meloxicam  (MOBIC ) 7.5 MG tablet Take 1 tablet (7.5 mg total) by mouth daily. 02/06/22   Smoot, Lauraine LABOR, PA-C  methocarbamol  (ROBAXIN ) 500 MG tablet Take 1 tablet (500 mg total) by mouth 2 (two) times daily. 02/06/22   Smoot, Lauraine LABOR, PA-C  naproxen  (NAPROSYN ) 375 MG tablet Take 1 tablet (375 mg total) by mouth 2 (two) times daily. 12/11/21   Lynwood Lenis, PA-C  estrogens , conjugated, (PREMARIN ) 1.25 MG tablet Take 2 tablets (2.5 mg total) by mouth 2 (two) times daily for 4 days. Patient not taking: Reported on 09/17/2019 06/15/18 03/01/20  Tobie Yetta HERO, MD  ferrous sulfate  325 (65 FE) MG tablet Take 1 tablet (325 mg total) by mouth daily with breakfast for 30 days. Patient not taking: Reported on 09/17/2019 06/15/18 03/01/20  Patel, Pranav M, MD    Allergies: Peanut butter flavoring agent (non-screening) and Codeine    Review of Systems  All other systems reviewed and are negative.   Updated Vital Signs BP 109/62   Pulse 60   Temp 98.6 F (37 C)   Resp 16   SpO2 100%   Physical Exam Vitals and nursing note reviewed.  Constitutional:      Appearance: She is  well-developed.  HENT:     Head: Normocephalic.  Cardiovascular:     Rate and Rhythm: Normal rate.  Pulmonary:     Effort: Pulmonary effort is normal.  Abdominal:     General: There is no distension.  Musculoskeletal:        General: Swelling and tenderness present. Normal range of motion.     Cervical back: Normal range of motion.     Comments: Swelling left leg compared with the right, decreased range of motion neurovascular neurosensory intact  Skin:    General: Skin is warm.  Neurological:     General: No focal deficit present.     Mental Status: She is alert and oriented to person, place, and time.     (all labs ordered are listed, but only abnormal results are displayed) Labs Reviewed - No data to display  EKG: None  Radiology: VAS US  LOWER EXTREMITY VENOUS (DVT) (7a-7p) Result Date: 12/13/2023  Lower Venous DVT Study Patient Name:  Karla Carey  Date of Exam:   12/13/2023 Medical Rec #: [id number]      Accession #:    [id number] Date of Birth: Dec 10, 1975      Patient Gender: F Patient Age:   27 years Exam Location:  [facility] Procedure:      VAS US  LOWER EXTREMITY VENOUS (  DVT) Referring Phys: UBALDO MCCAULEY --------------------------------------------------------------------------------  Indications: Pain, and in left leg and knee area for several days.  Limitations: Poor ultrasound/tissue interface. Comparison Study: 03/06/21 - LLE venous - negative for DVT. Positive for Baker's                   cyst 3.6 x 1.3 cm. Performing Technologist: Ricka Sturdivant-Jones RDMS, RVT  Examination Guidelines: A complete evaluation includes B-mode imaging, spectral Doppler, color Doppler, and power Doppler as needed of all accessible portions of each vessel. Bilateral testing is considered an integral part of a complete examination. Limited examinations for reoccurring indications may be performed as noted. The reflux portion of the exam is performed with the patient in reverse  Trendelenburg.  +-----+---------------+---------+-----------+----------+--------------+ RIGHTCompressibilityPhasicitySpontaneityPropertiesThrombus Aging +-----+---------------+---------+-----------+----------+--------------+ CFV  Full           Yes      Yes                                 +-----+---------------+---------+-----------+----------+--------------+ SFJ  Full                                                        +-----+---------------+---------+-----------+----------+--------------+   +---------+---------------+---------+-----------+----------+---------------+ LEFT     CompressibilityPhasicitySpontaneityPropertiesThrombus Aging  +---------+---------------+---------+-----------+----------+---------------+ CFV      Full           Yes      Yes                                  +---------+---------------+---------+-----------+----------+---------------+ SFJ      Full                                                         +---------+---------------+---------+-----------+----------+---------------+ FV Prox  Full                                                         +---------+---------------+---------+-----------+----------+---------------+ FV Mid   Full                                                         +---------+---------------+---------+-----------+----------+---------------+ FV Distal                                             patent by color +---------+---------------+---------+-----------+----------+---------------+ PFV      Full                                                         +---------+---------------+---------+-----------+----------+---------------+  POP      Full           Yes      Yes                                  +---------+---------------+---------+-----------+----------+---------------+ PTV      Full                                                          +---------+---------------+---------+-----------+----------+---------------+ PERO     Full                                                         +---------+---------------+---------+-----------+----------+---------------+    Summary: RIGHT: - No evidence of common femoral vein obstruction.   LEFT: - There is no evidence of deep vein thrombosis in the lower extremity.  A small residual possible ruptured Baker's Cyst measuring 1.8 x 0.84.  *See table(s) above for measurements and observations.    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 No acute fracture or dislocation is noted. No soft tissue abnormality is noted. IMPRESSION: Progressive degenerative change without acute abnormality. Electronically Signed   By: Oneil Devonshire M.D.   On: 12/13/2023 02:28     Procedures   Medications Ordered in the ED - No data to display                                  Medical Decision Making Patient complains of swelling and pain to her left lower leg.  Amount and/or Complexity of Data Reviewed Radiology: ordered and independent interpretation performed. Decision-making details documented in ED Course.    Details: X-ray left knee shows degenerative changes Ultrasound left knee shows no sign of DVT.  Patient does have a Baker's cyst.    Risk Risk Details: Patient counseled on results she is advised to follow-up with orthopedist for recheck.  Patient discharged in stable condition        Final diagnoses:  Left leg pain    ED Discharge Orders          Ordered    meloxicam  (MOBIC ) 7.5 MG tablet  Daily        12/13/23 0939          An After Visit Summary was printed and given to the patient.      Flint Sonny POUR, [place] 12/13/23 9060    Garrick Charleston, MD 12/13/23 1537
